# Patient Record
Sex: Male | Born: 1965 | Race: White | Hispanic: No | Marital: Married | Smoking: Never smoker
Health system: Southern US, Community
[De-identification: ages and names within clinical notes are randomized; demographics above are authoritative.]

## PROBLEM LIST (undated history)

## (undated) DIAGNOSIS — E785 Hyperlipidemia, unspecified: Secondary | ICD-10-CM

## (undated) HISTORY — PX: OTHER SURGICAL HISTORY: SHX169

## (undated) HISTORY — PX: VASECTOMY: SHX75

## (undated) HISTORY — DX: Hyperlipidemia, unspecified: E78.5

---

## 2005-09-25 ENCOUNTER — Encounter: Admission: RE | Admit: 2005-09-25 | Discharge: 2005-09-25 | Payer: Self-pay | Admitting: Otolaryngology

## 2008-01-06 IMAGING — RF DG ESOPHAGUS
7 of 8 series · 19 of 24 positions shown · non-contrast
Comparison: none

CLINICAL DATA: Dysphagia especially at the suprasternal notch level.   Treatment for GERD.
 DIAGNOSTIC ESOPHAGUS BARIUM SWALLOW:

[Series 1: run · 1 of 3 slices shown (1 of 7)]
[im 1/3]
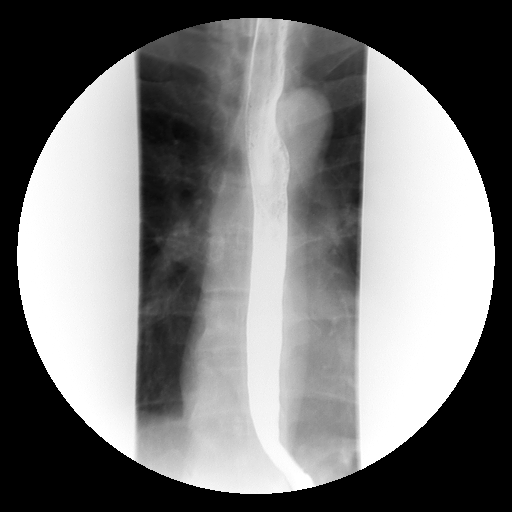

[Series 2: run · 1 of 2 slices shown (2 of 7)]
[im 1/2]
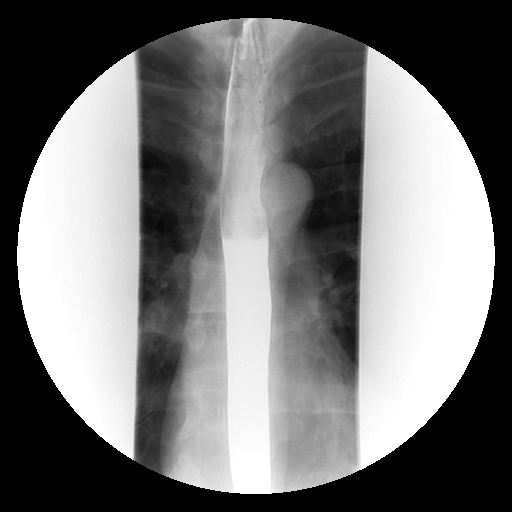

[Series 4: run · 5 of 13 slices shown (3 of 7)]
[im 1/13]
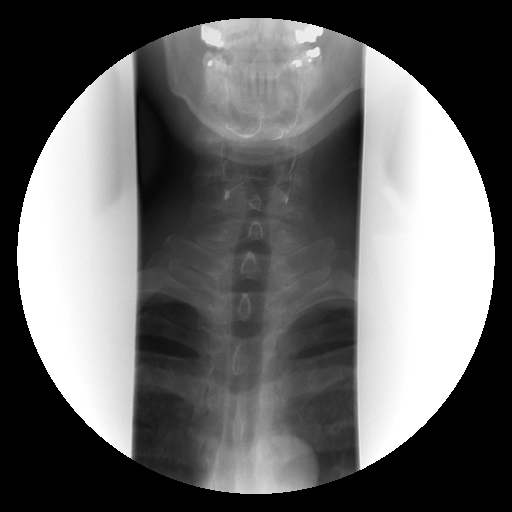
[im 3/13]
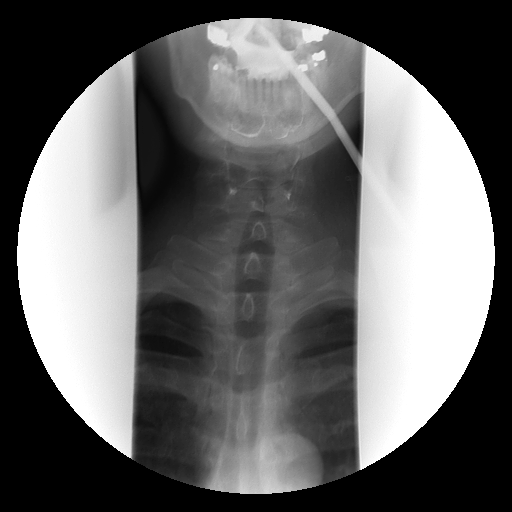
[im 5/13]
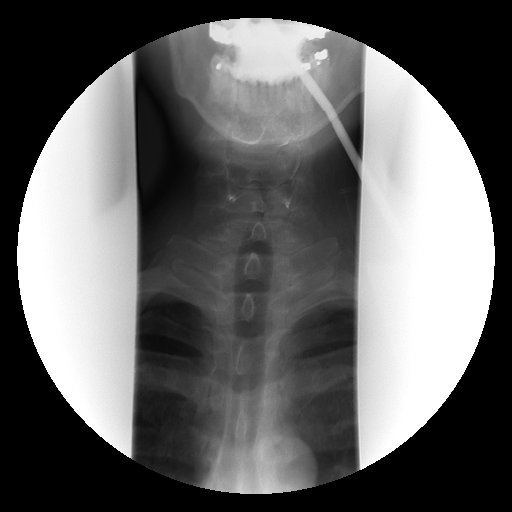
[im 8/13]
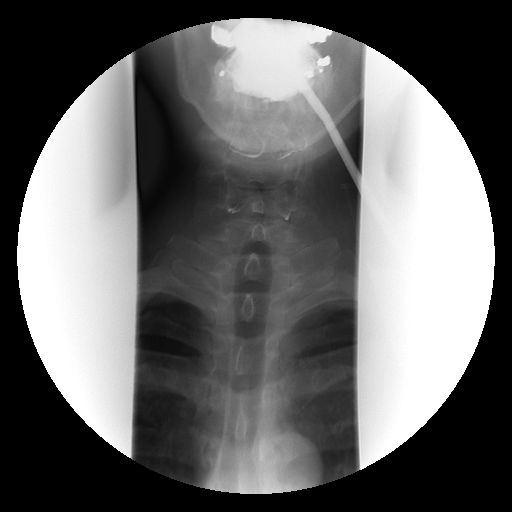
[im 13/13]
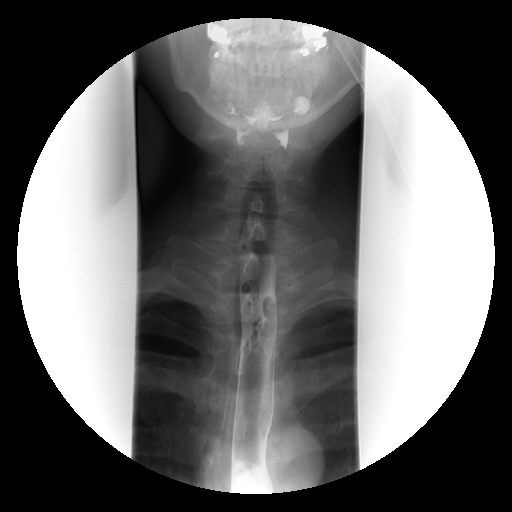

[Series 5: run · 2 of 8 slices shown (4 of 7)]
[im 1/8]
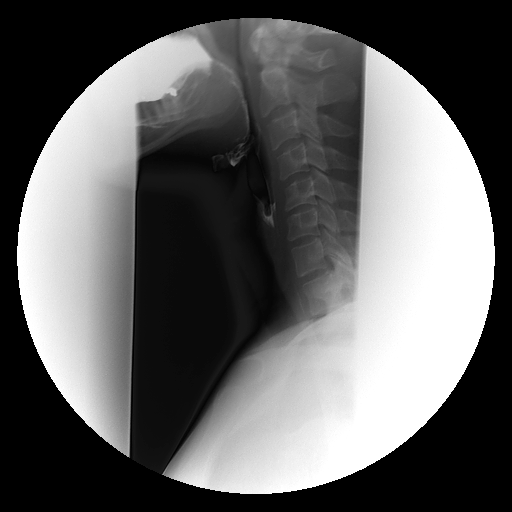
[im 4/8]
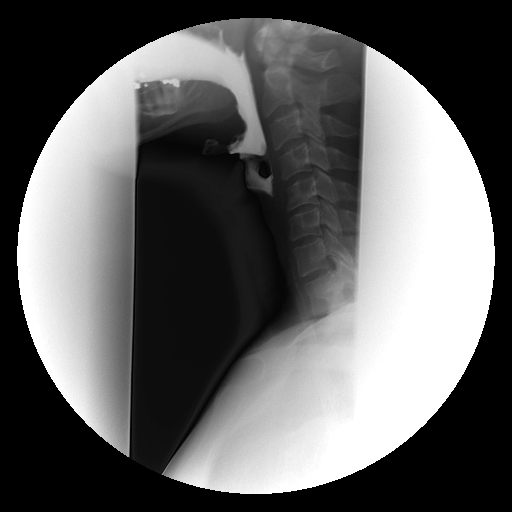

[Series 6: run · 5 of 13 slices shown (5 of 7)]
[im 1/13]
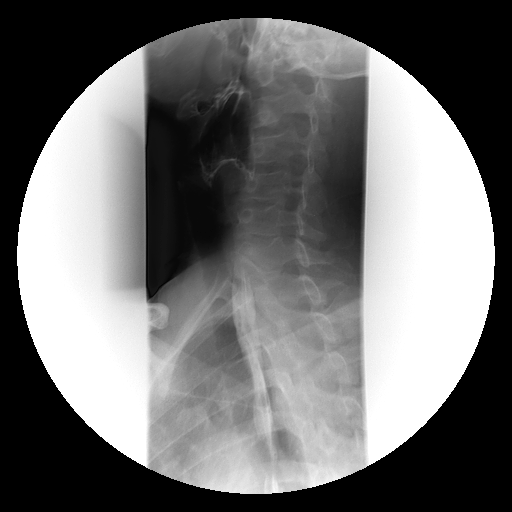
[im 3/13]
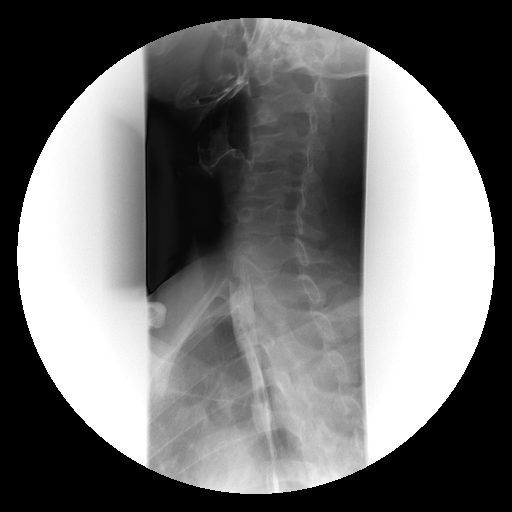
[im 5/13]
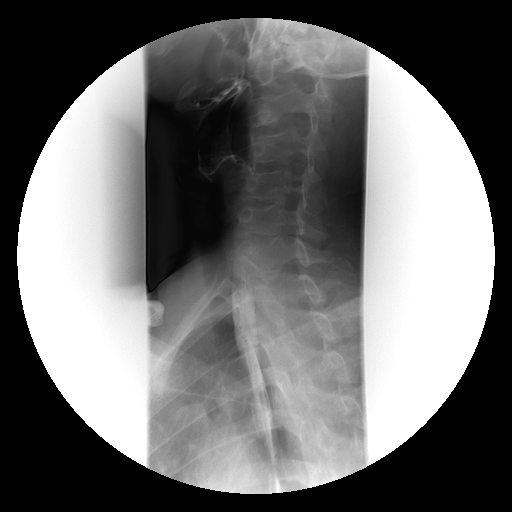
[im 8/13]
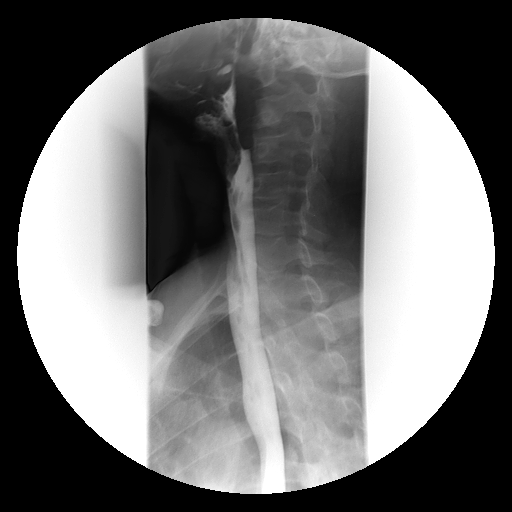
[im 13/13]
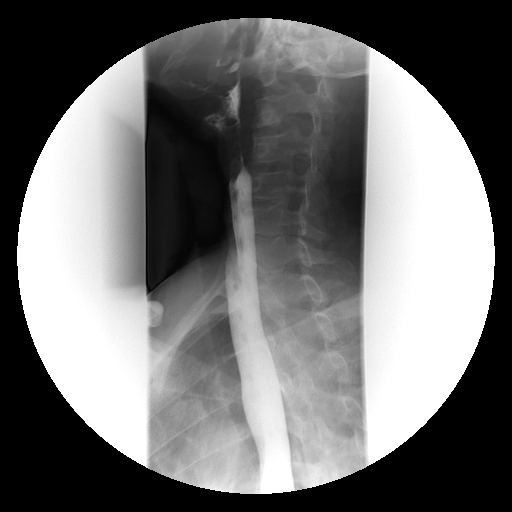

[Series 7: run · 1 of 3 slices shown (6 of 7)]
[im 1/3]
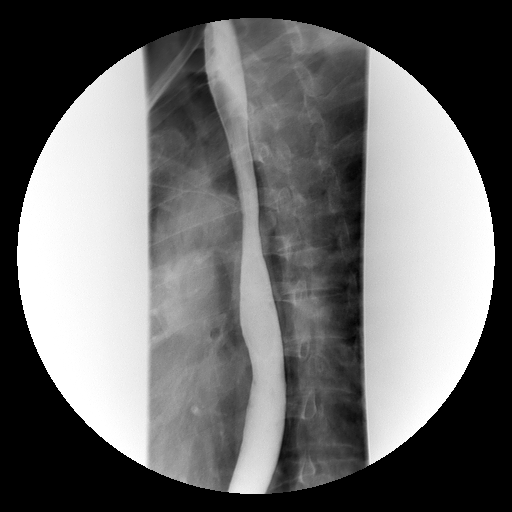

[Series 8: run · 4 of 11 slices shown (7 of 7)]
[im 1/11]
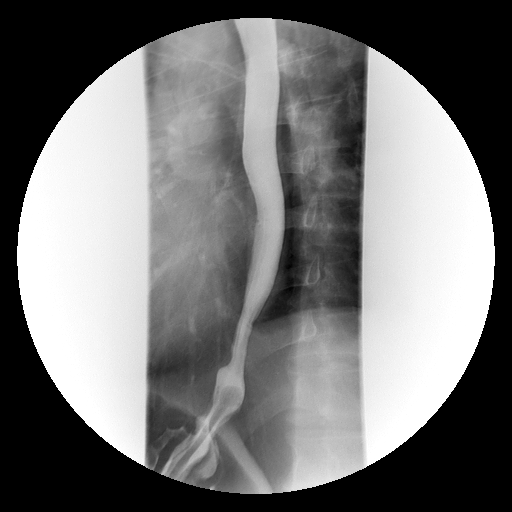
[im 3/11]
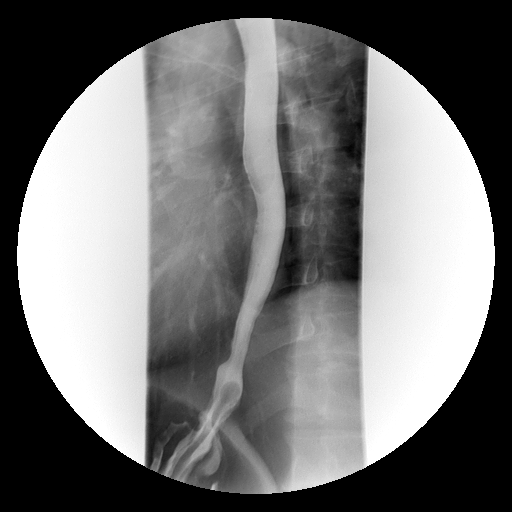
[im 8/11]
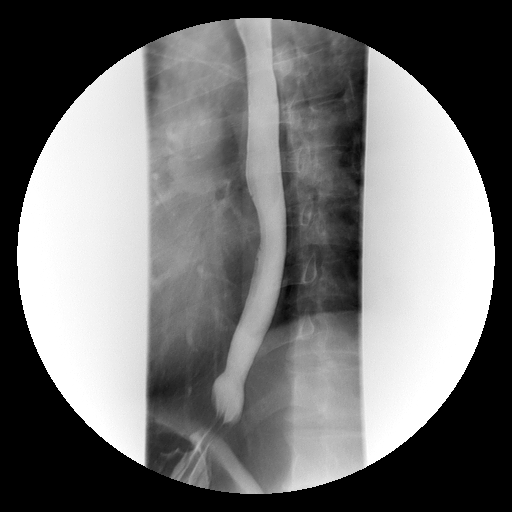
[im 11/11]
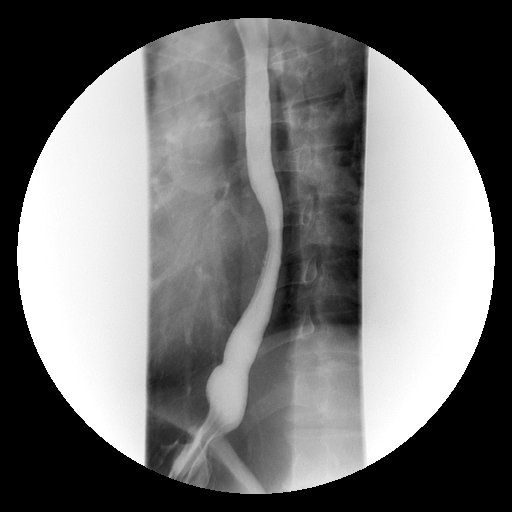

[19 of 24 positions shown; findings below may reference images not displayed]

FINDINGS: Normal antegrade peristalsis is seen with both double and single contrast techniques.  Ingested 13 mm barium tablet had slow passage at the thoracic esophagus at the level of the thoracic aortic arch.  No focal lesion or stricture is seen in the segment where barium tablet passage was slowed consistent with radiographically occult esophagitis.  Small sliding-type hiatal hernia is seen.  No demonstrable gastroesophageal reflux despite recumbent positioning, Valsalva maneuver, and water siphon test was demonstrated.   No other intrinsic nor extrinsic esophageal lesions are seen.
IMPRESSION: 1.  Small sliding-type hiatus hernia.
 2.  Slight slowing of 13 mm barium tablet at the thoracic esophagus (level of thoracic aortic arch) consistent with radiographically occult esophagitis presumably from gastroesophageal reflux currently not demonstrated.
 3.  Otherwise negative.

## 2018-10-24 DIAGNOSIS — Z0000 Encounter for general adult medical examination without abnormal findings: Secondary | ICD-10-CM | POA: Diagnosis not present

## 2018-10-31 DIAGNOSIS — Z1211 Encounter for screening for malignant neoplasm of colon: Secondary | ICD-10-CM | POA: Diagnosis not present

## 2018-10-31 DIAGNOSIS — R0989 Other specified symptoms and signs involving the circulatory and respiratory systems: Secondary | ICD-10-CM | POA: Diagnosis not present

## 2018-10-31 DIAGNOSIS — I10 Essential (primary) hypertension: Secondary | ICD-10-CM | POA: Diagnosis not present

## 2018-10-31 DIAGNOSIS — Z0000 Encounter for general adult medical examination without abnormal findings: Secondary | ICD-10-CM | POA: Diagnosis not present

## 2019-01-09 DIAGNOSIS — Z1211 Encounter for screening for malignant neoplasm of colon: Secondary | ICD-10-CM | POA: Diagnosis not present

## 2019-11-30 DIAGNOSIS — R079 Chest pain, unspecified: Secondary | ICD-10-CM | POA: Diagnosis not present

## 2019-11-30 DIAGNOSIS — Z6823 Body mass index (BMI) 23.0-23.9, adult: Secondary | ICD-10-CM | POA: Diagnosis not present

## 2019-12-11 DIAGNOSIS — Z20822 Contact with and (suspected) exposure to covid-19: Secondary | ICD-10-CM | POA: Diagnosis not present

## 2019-12-11 DIAGNOSIS — Z03818 Encounter for observation for suspected exposure to other biological agents ruled out: Secondary | ICD-10-CM | POA: Diagnosis not present

## 2020-08-06 ENCOUNTER — Other Ambulatory Visit: Payer: Self-pay

## 2020-08-06 ENCOUNTER — Encounter: Payer: Self-pay | Admitting: Family Medicine

## 2020-08-06 ENCOUNTER — Ambulatory Visit (INDEPENDENT_AMBULATORY_CARE_PROVIDER_SITE_OTHER): Payer: BC Managed Care – PPO | Admitting: Family Medicine

## 2020-08-06 VITALS — BP 109/71 | HR 78 | Temp 99.3°F | Ht 74.0 in | Wt 176.2 lb

## 2020-08-06 DIAGNOSIS — Z1159 Encounter for screening for other viral diseases: Secondary | ICD-10-CM

## 2020-08-06 DIAGNOSIS — E559 Vitamin D deficiency, unspecified: Secondary | ICD-10-CM

## 2020-08-06 DIAGNOSIS — Z0001 Encounter for general adult medical examination with abnormal findings: Secondary | ICD-10-CM

## 2020-08-06 DIAGNOSIS — E782 Mixed hyperlipidemia: Secondary | ICD-10-CM

## 2020-08-06 DIAGNOSIS — Z114 Encounter for screening for human immunodeficiency virus [HIV]: Secondary | ICD-10-CM

## 2020-08-06 DIAGNOSIS — Z0000 Encounter for general adult medical examination without abnormal findings: Secondary | ICD-10-CM

## 2020-08-06 DIAGNOSIS — D485 Neoplasm of uncertain behavior of skin: Secondary | ICD-10-CM

## 2020-08-06 DIAGNOSIS — Z125 Encounter for screening for malignant neoplasm of prostate: Secondary | ICD-10-CM

## 2020-08-06 MED ORDER — ATORVASTATIN CALCIUM 40 MG PO TABS: 40.0000 mg | ORAL_TABLET | Freq: Every day | ORAL | 3 refills | Status: DC

## 2020-08-06 NOTE — Progress Notes (Signed)
Established Patient Office Visit  Subjective:  Patient ID: Duane Sharp, male    DOB: Jun 15, 1965  Age: 55 y.o. MRN: [id number]  CC:  Chief Complaint  Patient presents with  . Establish Care    HPI Duane Sharp presents for complete physical and new patient visit.    in for follow-up of elevated cholesterol. Doing well without complaints on current medication. Denies side effects of statin including myalgia and arthralgia and nausea. Currently no chest pain, shortness of breath or other cardiovascular related symptoms noted.   Past Medical History:  Diagnosis Date  . Hyperlipidemia     Past Surgical History:  Procedure Laterality Date  . deviated septum repair    . VASECTOMY      Family History  Problem Relation Age of Onset  . Heart disease Mother   . Cancer Mother        stomach  . Multiple myeloma Father   . Heart disease Father     Social History   Socioeconomic History  . Marital status: Married    Spouse name: Almyra Free  . Number of children: 3  . Years of education: Not on file  . Highest education level: Master's degree (e.g., MA, MS, MEng, MEd, MSW, MBA)  Occupational History  . Occupation: Scientist, clinical (histocompatibility and immunogenetics): [employer]  Tobacco Use  . Smoking status: Never Smoker  . Smokeless tobacco: Current User  Vaping Use  . Vaping Use: Never used  Substance and Sexual Activity  . Alcohol use: Yes    Comment: weekend  . Drug use: Not Currently  . Sexual activity: Yes  Other Topics Concern  . Not on file  Social History Narrative  . Not on file   Social Determinants of Health   Financial Resource Strain: Not on file  Food Insecurity: Not on file  Transportation Needs: Not on file  Physical Activity: Not on file  Stress: Not on file  Social Connections: Not on file  Intimate Partner Violence: Not on file    Outpatient Medications Prior to Visit  Medication Sig Dispense Refill  . aspirin EC 81 MG tablet Take 81 mg by mouth daily.  Swallow whole.    . B Complex Vitamins (B COMPLEX PO) Take by mouth.    . Multiple Vitamin (MULTIVITAMIN ADULT PO) Take by mouth.    . simvastatin (ZOCOR) 20 MG tablet Take by mouth.     No facility-administered medications prior to visit.    No Known Allergies  ROS Review of Systems  Constitutional: Negative for activity change, fatigue and unexpected weight change.  HENT: Negative for congestion, ear pain, hearing loss, postnasal drip and trouble swallowing.   Eyes: Negative for pain and visual disturbance.  Respiratory: Negative for cough, chest tightness and shortness of breath.   Cardiovascular: Negative for chest pain, palpitations and leg swelling.  Gastrointestinal: Negative for abdominal distention, abdominal pain, blood in stool, constipation, diarrhea, nausea and vomiting.  Endocrine: Negative for cold intolerance, heat intolerance and polydipsia.  Genitourinary: Negative for difficulty urinating, dysuria, flank pain, frequency and urgency.  Musculoskeletal: Negative for arthralgias and joint swelling.  Skin: Negative for color change, rash and wound.  Neurological: Negative for dizziness, syncope, speech difficulty, weakness, light-headedness, numbness and headaches.  Hematological: Does not bruise/bleed easily.  Psychiatric/Behavioral: Negative for confusion, decreased concentration, dysphoric mood and sleep disturbance. The patient is not nervous/anxious.       Objective:    Physical Exam Constitutional:  Appearance: He is well-developed.  HENT:     Head: Normocephalic and atraumatic.  Eyes:     Pupils: Pupils are equal, round, and reactive to light.  Neck:     Thyroid: No thyromegaly.     Trachea: No tracheal deviation.  Cardiovascular:     Rate and Rhythm: Normal rate and regular rhythm.     Heart sounds: Normal heart sounds. No murmur heard. No friction rub. No gallop.   Pulmonary:     Breath sounds: Normal breath sounds. No wheezing or rales.   Abdominal:     Palpations: Abdomen is soft. There is no mass.     Tenderness: There is no abdominal tenderness.  Musculoskeletal:        General: Normal range of motion.     Cervical back: Normal range of motion.  Skin:    General: Skin is warm and dry.     Findings: Lesion ( 4 mm variegated lesion with irregular black center at mid back, midline.) present.  Neurological:     Mental Status: He is alert and oriented to person, place, and time.     BP 109/71   Pulse 78   Temp 99.3 F (37.4 C)   Ht 6' 2"  (1.88 m)   Wt 176 lb 3.2 oz (79.9 kg)   SpO2 97%   BMI 22.62 kg/m  Wt Readings from Last 3 Encounters:  08/06/20 176 lb 3.2 oz (79.9 kg)     Health Maintenance Due  Topic Date Due  . Hepatitis C Screening  Never done  . TETANUS/TDAP  Never done  . COLONOSCOPY (Pts 45-52yr Insurance coverage will need to be confirmed)  Never done    There are no preventive care reminders to display for this patient.  No results found for: TSH No results found for: WBC, HGB, HCT, MCV, PLT No results found for: NA, K, CHLORIDE, CO2, GLUCOSE, BUN, CREATININE, BILITOT, ALKPHOS, AST, ALT, PROT, ALBUMIN, CALCIUM, ANIONGAP, EGFR, GFR No results found for: CHOL No results found for: HDL No results found for: LDLCALC No results found for: TRIG No results found for: CHOLHDL No results found for: HGBA1C    Assessment & Plan:   Problem List Items Addressed This Visit   None   Visit Diagnoses    Well adult exam    -  Primary   Relevant Orders   CBC with Differential/Platelet   CMP14+EGFR   Lipid panel   PSA, total and free   Urinalysis   VITAMIN D 25 Hydroxy (Vit-D Deficiency, Fractures)   Hepatitis C antibody   HIV Antibody (routine testing w rflx)   Mixed hyperlipidemia       Relevant Medications   aspirin EC 81 MG tablet   atorvastatin (LIPITOR) 40 MG tablet   Other Relevant Orders   CBC with Differential/Platelet   CMP14+EGFR   Lipid panel   Vitamin D deficiency        Relevant Orders   VITAMIN D 25 Hydroxy (Vit-D Deficiency, Fractures)   Need for hepatitis C screening test       Relevant Orders   Hepatitis C antibody   Screening for prostate cancer       Relevant Orders   PSA, total and free   Encounter for screening for HIV       Relevant Orders   HIV Antibody (routine testing w rflx)   Neoplasm of uncertain behavior of skin       Relevant Orders   Ambulatory referral to Dermatology  Meds ordered this encounter  Medications  . atorvastatin (LIPITOR) 40 MG tablet    Sig: Take 1 tablet (40 mg total) by mouth daily. For cholesterol    Dispense:  90 tablet    Refill:  3    Follow-up: No follow-ups on file.    Claretta Fraise, MD

## 2020-08-07 ENCOUNTER — Other Ambulatory Visit: Payer: BC Managed Care – PPO

## 2020-08-07 DIAGNOSIS — Z1159 Encounter for screening for other viral diseases: Secondary | ICD-10-CM | POA: Diagnosis not present

## 2020-08-07 DIAGNOSIS — Z125 Encounter for screening for malignant neoplasm of prostate: Secondary | ICD-10-CM | POA: Diagnosis not present

## 2020-08-07 DIAGNOSIS — Z114 Encounter for screening for human immunodeficiency virus [HIV]: Secondary | ICD-10-CM | POA: Diagnosis not present

## 2020-08-07 DIAGNOSIS — Z0000 Encounter for general adult medical examination without abnormal findings: Secondary | ICD-10-CM | POA: Diagnosis not present

## 2020-08-07 DIAGNOSIS — E559 Vitamin D deficiency, unspecified: Secondary | ICD-10-CM | POA: Diagnosis not present

## 2020-08-07 DIAGNOSIS — E782 Mixed hyperlipidemia: Secondary | ICD-10-CM | POA: Diagnosis not present

## 2020-08-07 LAB — CBC WITH DIFFERENTIAL/PLATELET
Eos: 3 %
MCHC: 34.4 g/dL (ref 31.5–35.7)
Monocytes Absolute: 0.6 10*3/uL (ref 0.1–0.9)
Neutrophils Absolute: 3.2 10*3/uL (ref 1.4–7.0)
Platelets: 171 10*3/uL (ref 150–450)

## 2020-08-07 LAB — VITAMIN D 25 HYDROXY (VIT D DEFICIENCY, FRACTURES)

## 2020-08-07 LAB — URINALYSIS
Bilirubin, UA: NEGATIVE
Glucose, UA: NEGATIVE
Ketones, UA: NEGATIVE
Leukocytes,UA: NEGATIVE
Nitrite, UA: NEGATIVE
Protein,UA: NEGATIVE
RBC, UA: NEGATIVE
Specific Gravity, UA: 1.01 (ref 1.005–1.030)
Urobilinogen, Ur: 0.2 mg/dL (ref 0.2–1.0)
pH, UA: 7 (ref 5.0–7.5)

## 2020-08-07 LAB — PSA, TOTAL AND FREE

## 2020-08-07 LAB — CMP14+EGFR

## 2020-08-08 LAB — CBC WITH DIFFERENTIAL/PLATELET
Basophils Absolute: 0 10*3/uL (ref 0.0–0.2)
Basos: 1 %
EOS (ABSOLUTE): 0.1 10*3/uL (ref 0.0–0.4)
Hematocrit: 47.1 % (ref 37.5–51.0)
Hemoglobin: 16.2 g/dL (ref 13.0–17.7)
Immature Grans (Abs): 0 10*3/uL (ref 0.0–0.1)
Immature Granulocytes: 0 %
Lymphocytes Absolute: 1.5 10*3/uL (ref 0.7–3.1)
Lymphs: 28 %
MCH: 30.8 pg (ref 26.6–33.0)
MCV: 90 fL (ref 79–97)
Monocytes: 10 %
Neutrophils: 58 %
RBC: 5.26 x10E6/uL (ref 4.14–5.80)
RDW: 12.9 % (ref 11.6–15.4)
WBC: 5.5 10*3/uL (ref 3.4–10.8)

## 2020-08-08 LAB — HIV ANTIBODY (ROUTINE TESTING W REFLEX): HIV Screen 4th Generation wRfx: NONREACTIVE

## 2020-08-08 LAB — CMP14+EGFR
ALT: 24 IU/L (ref 0–44)
AST: 28 IU/L (ref 0–40)
Alkaline Phosphatase: 77 IU/L (ref 44–121)
BUN: 7 mg/dL (ref 6–24)
Bilirubin Total: 1.4 mg/dL — ABNORMAL HIGH (ref 0.0–1.2)
CO2: 24 mmol/L (ref 20–29)
Calcium: 9.3 mg/dL (ref 8.7–10.2)
Chloride: 99 mmol/L (ref 96–106)
Creatinine, Ser: 0.98 mg/dL (ref 0.76–1.27)
Globulin, Total: 1.7 g/dL (ref 1.5–4.5)
Glucose: 90 mg/dL (ref 65–99)
Potassium: 4.3 mmol/L (ref 3.5–5.2)
Sodium: 137 mmol/L (ref 134–144)
Total Protein: 6.6 g/dL (ref 6.0–8.5)
eGFR: 91 mL/min/{1.73_m2} (ref >59)

## 2020-08-08 LAB — PSA, TOTAL AND FREE
PSA, Free Pct: 50 %
Prostate Specific Ag, Serum: 0.5 ng/mL (ref 0.0–4.0)

## 2020-08-08 LAB — LIPID PANEL
Chol/HDL Ratio: 3.2 ratio (ref 0.0–5.0)
Cholesterol, Total: 143 mg/dL (ref 100–199)
HDL: 45 mg/dL (ref >39)
LDL Chol Calc (NIH): 75 mg/dL (ref 0–99)
Triglycerides: 132 mg/dL (ref 0–149)
VLDL Cholesterol Cal: 23 mg/dL (ref 5–40)

## 2020-08-08 LAB — HEPATITIS C ANTIBODY: Hep C Virus Ab: <0.1 s/co ratio (ref 0.0–0.9)

## 2020-08-08 NOTE — Progress Notes (Signed)
Dear Darlyn Chamber, Your Vitamin D is  low. You need a prescription strength supplement I will send that in for you. Nurse, if at all possible, could you send in a prescription for the patient for vitamin D 50,000 units, 1 p.o. weekly #13 with 3 refills? Many thanks, WS

## 2020-08-09 ENCOUNTER — Other Ambulatory Visit: Payer: Self-pay | Admitting: *Deleted

## 2020-08-09 MED ORDER — VITAMIN D (ERGOCALCIFEROL) 1.25 MG (50000 UNIT) PO CAPS: 50000.0000 [IU] | ORAL_CAPSULE | ORAL | 3 refills | Status: DC

## 2020-09-18 DIAGNOSIS — D225 Melanocytic nevi of trunk: Secondary | ICD-10-CM | POA: Diagnosis not present

## 2020-09-19 DIAGNOSIS — I788 Other diseases of capillaries: Secondary | ICD-10-CM | POA: Diagnosis not present

## 2020-09-19 DIAGNOSIS — L814 Other melanin hyperpigmentation: Secondary | ICD-10-CM | POA: Diagnosis not present

## 2020-09-19 DIAGNOSIS — D485 Neoplasm of uncertain behavior of skin: Secondary | ICD-10-CM | POA: Diagnosis not present

## 2020-09-19 DIAGNOSIS — L821 Other seborrheic keratosis: Secondary | ICD-10-CM | POA: Diagnosis not present

## 2020-10-07 ENCOUNTER — Telehealth: Payer: Self-pay | Admitting: Family Medicine

## 2020-10-07 MED ORDER — SIMVASTATIN 20 MG PO TABS: 20.0000 mg | ORAL_TABLET | Freq: Every day | ORAL | 2 refills | Status: DC

## 2020-10-07 NOTE — Telephone Encounter (Signed)
Patient is wanting to know if he can continue with the simvastatin that he was on since his cholesterol is good. Please advise

## 2020-10-07 NOTE — Telephone Encounter (Signed)
Yes. Please continue

## 2020-10-07 NOTE — Telephone Encounter (Signed)
Patient aware, med refill sent in.

## 2021-01-05 ENCOUNTER — Other Ambulatory Visit: Payer: Self-pay | Admitting: Family Medicine

## 2021-01-06 NOTE — Telephone Encounter (Signed)
OV 08/06/20 RTC 1 yr Next OV 08/07/21

## 2021-01-13 DIAGNOSIS — D485 Neoplasm of uncertain behavior of skin: Secondary | ICD-10-CM | POA: Diagnosis not present

## 2021-01-13 DIAGNOSIS — L988 Other specified disorders of the skin and subcutaneous tissue: Secondary | ICD-10-CM | POA: Diagnosis not present

## 2021-03-31 ENCOUNTER — Ambulatory Visit: Payer: BC Managed Care – PPO | Admitting: Family Medicine

## 2021-03-31 ENCOUNTER — Encounter: Payer: Self-pay | Admitting: Family Medicine

## 2021-03-31 VITALS — BP 110/62 | HR 75 | Temp 98.0°F | Ht 74.0 in | Wt 186.2 lb

## 2021-03-31 DIAGNOSIS — Z23 Encounter for immunization: Secondary | ICD-10-CM | POA: Diagnosis not present

## 2021-03-31 DIAGNOSIS — N529 Male erectile dysfunction, unspecified: Secondary | ICD-10-CM

## 2021-03-31 MED ORDER — SILDENAFIL CITRATE 20 MG PO TABS: ORAL_TABLET | ORAL | 5 refills | Status: DC

## 2021-03-31 NOTE — Progress Notes (Signed)
Subjective:  Patient ID: DEMARRIO MENGES, male    DOB: 05-Jan-1966  Age: 56 y.o. MRN: [id number]  CC: Erectile Dysfunction   HPI Duane Sharp presents for ED increasing over 2 years. No problem with relationship. Desire is good. Married 24 years. Quit smoking 18 years ago. Able to workout hard. Energy, strength are at usual levels.   Depression screen [facility] 2/9 03/31/2021 08/06/2020  Decreased Interest 0 0  Down, Depressed, Hopeless 0 0  PHQ - 2 Score 0 0    History Duane Sharp has a past medical history of Hyperlipidemia.   Duane Sharp has a past surgical history that includes deviated septum repair and Vasectomy.   His family history includes Cancer in his mother; Heart disease in his father and mother; Multiple myeloma in his father.Duane Sharp reports that Duane Sharp has never smoked. Duane Sharp uses smokeless tobacco. Duane Sharp reports current alcohol use. Duane Sharp reports that Duane Sharp does not currently use drugs.    ROS Review of Systems  Constitutional:  Negative for fever.  Respiratory:  Negative for shortness of breath.   Cardiovascular:  Negative for chest pain.  Musculoskeletal:  Negative for arthralgias.  Skin:  Negative for rash.   Objective:  BP 110/62    Pulse 75    Temp 98 F (36.7 C)    Ht 6' 2"  (1.88 m)    Wt 186 lb 3.2 oz (84.5 kg)    SpO2 97%    BMI 23.91 kg/m   BP Readings from Last 3 Encounters:  03/31/21 110/62  08/06/20 109/71    Wt Readings from Last 3 Encounters:  03/31/21 186 lb 3.2 oz (84.5 kg)  08/06/20 176 lb 3.2 oz (79.9 kg)     Physical Exam Vitals reviewed.  Constitutional:      Appearance: Duane Sharp is well-developed.  HENT:     Head: Normocephalic and atraumatic.     Right Ear: External ear normal.     Left Ear: External ear normal.     Mouth/Throat:     Pharynx: No oropharyngeal exudate or posterior oropharyngeal erythema.  Eyes:     Pupils: Pupils are equal, round, and reactive to light.  Cardiovascular:     Rate and Rhythm: Normal rate and regular rhythm.     Heart sounds: No murmur  heard. Pulmonary:     Effort: No respiratory distress.     Breath sounds: Normal breath sounds.  Abdominal:     Hernia: There is no hernia in the left inguinal area or right inguinal area.  Genitourinary:    Pubic Area: No rash.      Penis: Normal and circumcised. No phimosis, hypospadias, erythema, tenderness, swelling or lesions.      Testes: Normal.        Right: Mass, tenderness, swelling, testicular hydrocele or varicocele not present.        Left: Mass, tenderness, swelling, testicular hydrocele or varicocele not present.     Epididymis:     Right: Normal.     Left: Normal.     Tanner stage (genital): 5.  Musculoskeletal:     Cervical back: Normal range of motion and neck supple.  Neurological:     Mental Status: Duane Sharp is alert and oriented to person, place, and time.      Assessment & Plan:   Duane Sharp was seen today for erectile dysfunction.  Diagnoses and all orders for this visit:  Erectile dysfunction, unspecified erectile dysfunction type -     Testosterone,Free and Total -  TSH  Need for immunization against influenza -     Flu Vaccine QUAD 42moIM (Fluarix, Fluzone & Alfiuria Quad PF)  Other orders -     sildenafil (REVATIO) 20 MG tablet; Take 2-5 pills at once, orally, with each sexual encounter       I am having WTeressa Senter Fero start on sildenafil. I am also having him maintain his Multiple Vitamin (MULTIVITAMIN ADULT PO), B Complex Vitamins (B COMPLEX PO), aspirin EC, Vitamin D (Ergocalciferol), and simvastatin.  Allergies as of 03/31/2021   No Known Allergies      Medication List        Accurate as of March 31, 2021  8:41 PM. If you have any questions, ask your nurse or doctor.          aspirin EC 81 MG tablet Take 81 mg by mouth daily. Swallow whole.   B COMPLEX PO Take by mouth.   MULTIVITAMIN ADULT PO Take by mouth.   sildenafil 20 MG tablet Commonly known as: REVATIO Take 2-5 pills at once, orally, with each sexual  encounter Started by: WClaretta Fraise MD   simvastatin 20 MG tablet Commonly known as: ZOCOR TAKE 1 TABLET BY MOUTH EVERY DAY   Vitamin D (Ergocalciferol) 1.25 MG (50000 UNIT) Caps capsule Commonly known as: DRISDOL Take 1 capsule (50,000 Units total) by mouth every 7 (seven) days.         Follow-up: Return in about 6 months (around 09/28/2021).  WClaretta Fraise M.D.

## 2021-04-01 ENCOUNTER — Other Ambulatory Visit: Payer: BC Managed Care – PPO

## 2021-04-01 DIAGNOSIS — N529 Male erectile dysfunction, unspecified: Secondary | ICD-10-CM | POA: Diagnosis not present

## 2021-04-03 LAB — TESTOSTERONE,FREE AND TOTAL
Testosterone, Free: 10.1 pg/mL (ref 7.2–24.0)
Testosterone: 277 ng/dL (ref 264–916)

## 2021-04-03 LAB — TSH: TSH: 3.92 u[IU]/mL (ref 0.450–4.500)

## 2021-08-07 ENCOUNTER — Encounter: Payer: BC Managed Care – PPO | Admitting: Family Medicine

## 2021-08-21 ENCOUNTER — Other Ambulatory Visit: Payer: Self-pay | Admitting: Family Medicine

## 2022-01-26 ENCOUNTER — Other Ambulatory Visit: Payer: Self-pay | Admitting: Family Medicine

## 2022-01-26 ENCOUNTER — Telehealth: Payer: Self-pay | Admitting: Family Medicine

## 2022-01-26 DIAGNOSIS — E782 Mixed hyperlipidemia: Secondary | ICD-10-CM

## 2022-01-26 DIAGNOSIS — E559 Vitamin D deficiency, unspecified: Secondary | ICD-10-CM

## 2022-01-26 NOTE — Telephone Encounter (Signed)
Labs ordered. Let pt. Know.

## 2022-01-26 NOTE — Telephone Encounter (Signed)
Patient would like to come in before his appointment to have lab work done. Please call when orders have been added.

## 2022-01-26 NOTE — Telephone Encounter (Signed)
What labs would you like for him?

## 2022-01-26 NOTE — Telephone Encounter (Signed)
Pt aware.

## 2022-04-01 ENCOUNTER — Other Ambulatory Visit: Payer: BC Managed Care – PPO

## 2022-04-01 DIAGNOSIS — E782 Mixed hyperlipidemia: Secondary | ICD-10-CM | POA: Diagnosis not present

## 2022-04-01 DIAGNOSIS — Z1329 Encounter for screening for other suspected endocrine disorder: Secondary | ICD-10-CM | POA: Diagnosis not present

## 2022-04-01 DIAGNOSIS — E559 Vitamin D deficiency, unspecified: Secondary | ICD-10-CM | POA: Diagnosis not present

## 2022-04-01 DIAGNOSIS — Z125 Encounter for screening for malignant neoplasm of prostate: Secondary | ICD-10-CM | POA: Diagnosis not present

## 2022-04-01 LAB — CBC WITH DIFFERENTIAL/PLATELET
Basos: 1 %
Eos: 5 %
Hemoglobin: 14.7 g/dL (ref 13.0–17.7)
Immature Grans (Abs): 0 10*3/uL (ref 0.0–0.1)
Lymphs: 33 %
Neutrophils: 48 %
Platelets: 172 10*3/uL (ref 150–450)
RBC: 4.94 x10E6/uL (ref 4.14–5.80)
WBC: 4.8 10*3/uL (ref 3.4–10.8)

## 2022-04-01 LAB — CMP14+EGFR
ALT: 31 IU/L (ref 0–44)
Albumin/Globulin Ratio: 1.9 (ref 1.2–2.2)
Albumin: 4.2 g/dL (ref 3.8–4.9)
BUN/Creatinine Ratio: 9 (ref 9–20)
BUN: 9 mg/dL (ref 6–24)
Bilirubin Total: 1.4 mg/dL — ABNORMAL HIGH (ref 0.0–1.2)
CO2: 23 mmol/L (ref 20–29)
Glucose: 91 mg/dL (ref 70–99)
Sodium: 137 mmol/L (ref 134–144)
eGFR: 91 mL/min/{1.73_m2} (ref >59)

## 2022-04-01 LAB — LIPID PANEL

## 2022-04-01 LAB — VITAMIN D 25 HYDROXY (VIT D DEFICIENCY, FRACTURES)

## 2022-04-02 ENCOUNTER — Ambulatory Visit (INDEPENDENT_AMBULATORY_CARE_PROVIDER_SITE_OTHER): Payer: BC Managed Care – PPO | Admitting: Family Medicine

## 2022-04-02 ENCOUNTER — Encounter: Payer: Self-pay | Admitting: Family Medicine

## 2022-04-02 VITALS — BP 114/71 | HR 71 | Temp 97.2°F | Ht 74.0 in | Wt 182.8 lb

## 2022-04-02 DIAGNOSIS — E782 Mixed hyperlipidemia: Secondary | ICD-10-CM

## 2022-04-02 DIAGNOSIS — Z0000 Encounter for general adult medical examination without abnormal findings: Secondary | ICD-10-CM

## 2022-04-02 DIAGNOSIS — Z0001 Encounter for general adult medical examination with abnormal findings: Secondary | ICD-10-CM | POA: Diagnosis not present

## 2022-04-02 DIAGNOSIS — N529 Male erectile dysfunction, unspecified: Secondary | ICD-10-CM | POA: Diagnosis not present

## 2022-04-02 DIAGNOSIS — E559 Vitamin D deficiency, unspecified: Secondary | ICD-10-CM | POA: Diagnosis not present

## 2022-04-02 DIAGNOSIS — Z125 Encounter for screening for malignant neoplasm of prostate: Secondary | ICD-10-CM

## 2022-04-02 LAB — CBC WITH DIFFERENTIAL/PLATELET
Basophils Absolute: 0 10*3/uL (ref 0.0–0.2)
EOS (ABSOLUTE): 0.2 10*3/uL (ref 0.0–0.4)
Hematocrit: 42.9 % (ref 37.5–51.0)
Immature Granulocytes: 0 %
Lymphocytes Absolute: 1.6 10*3/uL (ref 0.7–3.1)
MCH: 29.8 pg (ref 26.6–33.0)
MCHC: 34.3 g/dL (ref 31.5–35.7)
MCV: 87 fL (ref 79–97)
Monocytes Absolute: 0.6 10*3/uL (ref 0.1–0.9)
Monocytes: 13 %
Neutrophils Absolute: 2.3 10*3/uL (ref 1.4–7.0)
RDW: 12.6 % (ref 11.6–15.4)

## 2022-04-02 LAB — LIPID PANEL
Chol/HDL Ratio: 5.6 ratio — ABNORMAL HIGH (ref 0.0–5.0)
LDL Chol Calc (NIH): 138 mg/dL — ABNORMAL HIGH (ref 0–99)
Triglycerides: 225 mg/dL — ABNORMAL HIGH (ref 0–149)
VLDL Cholesterol Cal: 41 mg/dL — ABNORMAL HIGH (ref 5–40)

## 2022-04-02 LAB — CMP14+EGFR
AST: 21 IU/L (ref 0–40)
Alkaline Phosphatase: 69 IU/L (ref 44–121)
Calcium: 9.3 mg/dL (ref 8.7–10.2)
Chloride: 100 mmol/L (ref 96–106)
Creatinine, Ser: 0.97 mg/dL (ref 0.76–1.27)
Globulin, Total: 2.2 g/dL (ref 1.5–4.5)
Potassium: 4.7 mmol/L (ref 3.5–5.2)
Total Protein: 6.4 g/dL (ref 6.0–8.5)

## 2022-04-02 MED ORDER — ROSUVASTATIN CALCIUM 10 MG PO TABS: 10.0000 mg | ORAL_TABLET | Freq: Every day | ORAL | 1 refills | Status: DC

## 2022-04-02 MED ORDER — CALCIUM CARBONATE 600 MG PO TABS: 600.0000 mg | ORAL_TABLET | Freq: Two times a day (BID) | ORAL | 99 refills | Status: AC

## 2022-04-02 MED ORDER — SILDENAFIL CITRATE 20 MG PO TABS: ORAL_TABLET | ORAL | 5 refills | Status: DC

## 2022-04-02 MED ORDER — VITAMIN D (ERGOCALCIFEROL) 1.25 MG (50000 UNIT) PO CAPS: 50000.0000 [IU] | ORAL_CAPSULE | ORAL | 3 refills | Status: DC

## 2022-04-02 NOTE — Progress Notes (Signed)
Subjective:  Patient ID: Duane Sharp, male    DOB: 08/07/1965  Age: 57 y.o. MRN: [id number]  CC: Annual Exam   HPI Duane Sharp presents for CPE  Muscle aches with simvastatin. Stopped takng 2 mos ago. Dced Vit D aaafter finishing the bottle. Drinks a lot of milk. Avoids soda.      04/02/2022    8:00 AM 03/31/2021    3:52 PM 08/06/2020   11:01 AM  Depression screen PHQ 2/9  Decreased Interest 0 0 0  Down, Depressed, Hopeless 0 0 0  PHQ - 2 Score 0 0 0    History Duane Sharp has a past medical history of Hyperlipidemia.   He has a past surgical history that includes deviated septum repair and Vasectomy.   His family history includes Cancer in his mother; Heart disease in his father and mother; Multiple myeloma in his father.He reports that he has never smoked. He uses smokeless tobacco. He reports current alcohol use. He reports that he does not currently use drugs.    ROS Review of Systems  Constitutional:  Negative for activity change, fatigue and unexpected weight change.  HENT:  Negative for congestion, ear pain, hearing loss, postnasal drip and trouble swallowing.   Eyes:  Negative for pain and visual disturbance.  Respiratory:  Negative for cough, chest tightness and shortness of breath.   Cardiovascular:  Negative for chest pain, palpitations and leg swelling.  Gastrointestinal:  Negative for abdominal distention, abdominal pain, blood in stool, constipation, diarrhea, nausea and vomiting.  Endocrine: Negative for cold intolerance, heat intolerance and polydipsia.  Genitourinary:  Negative for difficulty urinating, dysuria, flank pain, frequency and urgency.  Musculoskeletal:  Positive for myalgias (with zocor). Negative for arthralgias and joint swelling.  Skin:  Negative for color change, rash and wound.  Neurological:  Negative for dizziness, syncope, speech difficulty, weakness, light-headedness, numbness and headaches.  Hematological:  Does not bruise/bleed  easily.  Psychiatric/Behavioral:  Negative for confusion, decreased concentration, dysphoric mood and sleep disturbance. The patient is not nervous/anxious.     Objective:  BP 114/71   Pulse 71   Temp (!) 97.2 F (36.2 C)   Ht '6\' 2"'$  (1.88 m)   Wt 182 lb 12.8 oz (82.9 kg)   SpO2 94%   BMI 23.47 kg/m   BP Readings from Last 3 Encounters:  04/02/22 114/71  03/31/21 110/62  08/06/20 109/71    Wt Readings from Last 3 Encounters:  04/02/22 182 lb 12.8 oz (82.9 kg)  03/31/21 186 lb 3.2 oz (84.5 kg)  08/06/20 176 lb 3.2 oz (79.9 kg)     Physical Exam Constitutional:      Appearance: He is well-developed.  HENT:     Head: Normocephalic and atraumatic.  Eyes:     Pupils: Pupils are equal, round, and reactive to light.  Neck:     Thyroid: No thyromegaly.     Trachea: No tracheal deviation.  Cardiovascular:     Rate and Rhythm: Normal rate and regular rhythm.     Heart sounds: Normal heart sounds. No murmur heard.    No friction rub. No gallop.  Pulmonary:     Breath sounds: Normal breath sounds. No wheezing or rales.  Abdominal:     General: Bowel sounds are normal. There is no distension.     Palpations: Abdomen is soft. There is no mass.     Tenderness: There is no abdominal tenderness.     Hernia: There is no hernia in the  left inguinal area.  Genitourinary:    Penis: Normal.      Testes: Normal.  Musculoskeletal:        General: Normal range of motion.     Cervical back: Normal range of motion.  Lymphadenopathy:     Cervical: No cervical adenopathy.  Skin:    General: Skin is warm and dry.  Neurological:     Mental Status: He is alert and oriented to person, place, and time.       Assessment & Plan:   Duane Sharp was seen today for annual exam.  Diagnoses and all orders for this visit:  Well adult exam -     Urinalysis  Vitamin D deficiency  Mixed hyperlipidemia  Erectile dysfunction, unspecified erectile dysfunction type -     Testosterone,Free and  Total  Screening for prostate cancer -     PSA, total and free  Other orders -     Vitamin D, Ergocalciferol, (DRISDOL) 1.25 MG (50000 UNIT) CAPS capsule; Take 1 capsule (50,000 Units total) by mouth every 7 (seven) days. -     sildenafil (REVATIO) 20 MG tablet; Take 2-5 pills at once, orally, with each sexual encounter -     rosuvastatin (CRESTOR) 10 MG tablet; Take 1 tablet (10 mg total) by mouth daily. For cholesterol -     calcium carbonate (CALCIUM 600) 600 MG TABS tablet; Take 1 tablet (600 mg total) by mouth 2 (two) times daily with a meal.       I have discontinued Duane Sharp. Cara's simvastatin. I am also having him start on rosuvastatin and calcium carbonate. Additionally, I am having him maintain his Multiple Vitamin (MULTIVITAMIN ADULT PO), B Complex Vitamins (B COMPLEX PO), aspirin EC, Vitamin D (Ergocalciferol), and sildenafil.  Allergies as of 04/02/2022       Reactions   Simvastatin    myalgia        Medication List        Accurate as of April 02, 2022  8:33 AM. If you have any questions, ask your nurse or doctor.          STOP taking these medications    simvastatin 20 MG tablet Commonly known as: ZOCOR Stopped by: Claretta Fraise, MD       TAKE these medications    aspirin EC 81 MG tablet Take 81 mg by mouth daily. Swallow whole.   B COMPLEX PO Take by mouth.   calcium carbonate 600 MG Tabs tablet Commonly known as: Calcium 600 Take 1 tablet (600 mg total) by mouth 2 (two) times daily with a meal. Started by: Claretta Fraise, MD   MULTIVITAMIN ADULT PO Take by mouth.   rosuvastatin 10 MG tablet Commonly known as: Crestor Take 1 tablet (10 mg total) by mouth daily. For cholesterol Started by: Claretta Fraise, MD   sildenafil 20 MG tablet Commonly known as: REVATIO Take 2-5 pills at once, orally, with each sexual encounter   Vitamin D (Ergocalciferol) 1.25 MG (50000 UNIT) Caps capsule Commonly known as: DRISDOL Take 1 capsule (50,000  Units total) by mouth every 7 (seven) days.         Follow-up: Return in about 1 year (around 04/03/2023).  Claretta Fraise, M.D.

## 2022-04-03 LAB — TESTOSTERONE

## 2022-04-04 LAB — PSA: Prostate Specific Ag, Serum: 0.4 ng/mL (ref 0.0–4.0)

## 2022-04-04 LAB — SPECIMEN STATUS REPORT

## 2022-08-26 ENCOUNTER — Ambulatory Visit (INDEPENDENT_AMBULATORY_CARE_PROVIDER_SITE_OTHER): Payer: PRIVATE HEALTH INSURANCE | Admitting: Family Medicine

## 2022-08-26 ENCOUNTER — Encounter: Payer: Self-pay | Admitting: Family Medicine

## 2022-08-26 VITALS — BP 117/68 | HR 73 | Temp 97.3°F | Ht 74.0 in | Wt 190.0 lb

## 2022-08-26 DIAGNOSIS — Z131 Encounter for screening for diabetes mellitus: Secondary | ICD-10-CM

## 2022-08-26 DIAGNOSIS — R351 Nocturia: Secondary | ICD-10-CM

## 2022-08-26 DIAGNOSIS — N401 Enlarged prostate with lower urinary tract symptoms: Secondary | ICD-10-CM | POA: Diagnosis not present

## 2022-08-26 DIAGNOSIS — E782 Mixed hyperlipidemia: Secondary | ICD-10-CM | POA: Diagnosis not present

## 2022-08-26 LAB — LIPID PANEL
Chol/HDL Ratio: 5.7 ratio — ABNORMAL HIGH (ref 0.0–5.0)
Cholesterol, Total: 183 mg/dL (ref 100–199)
HDL: 32 mg/dL — ABNORMAL LOW (ref >39)
LDL Chol Calc (NIH): 86 mg/dL (ref 0–99)
Triglycerides: 397 mg/dL — ABNORMAL HIGH (ref 0–149)
VLDL Cholesterol Cal: 65 mg/dL — ABNORMAL HIGH (ref 5–40)

## 2022-08-26 LAB — CMP14+EGFR
ALT: 29 IU/L (ref 0–44)
AST: 25 IU/L (ref 0–40)
Albumin/Globulin Ratio: 2.5 — ABNORMAL HIGH (ref 1.2–2.2)
Albumin: 4.7 g/dL (ref 3.8–4.9)
Alkaline Phosphatase: 70 IU/L (ref 44–121)
BUN/Creatinine Ratio: 13 (ref 9–20)
BUN: 13 mg/dL (ref 6–24)
Bilirubin Total: 1.6 mg/dL — ABNORMAL HIGH (ref 0.0–1.2)
CO2: 23 mmol/L (ref 20–29)
Calcium: 9.5 mg/dL (ref 8.7–10.2)
Chloride: 99 mmol/L (ref 96–106)
Creatinine, Ser: 0.99 mg/dL (ref 0.76–1.27)
Globulin, Total: 1.9 g/dL (ref 1.5–4.5)
Glucose: 98 mg/dL (ref 70–99)
Potassium: 4.7 mmol/L (ref 3.5–5.2)
Sodium: 137 mmol/L (ref 134–144)
Total Protein: 6.6 g/dL (ref 6.0–8.5)
eGFR: 89 mL/min/{1.73_m2} (ref >59)

## 2022-08-26 LAB — CBC WITH DIFFERENTIAL/PLATELET
Basophils Absolute: 0.1 10*3/uL (ref 0.0–0.2)
Basos: 1 %
EOS (ABSOLUTE): 0.2 10*3/uL (ref 0.0–0.4)
Eos: 4 %
Hematocrit: 46.1 % (ref 37.5–51.0)
Hemoglobin: 15.9 g/dL (ref 13.0–17.7)
Immature Grans (Abs): 0 10*3/uL (ref 0.0–0.1)
Immature Granulocytes: 0 %
Lymphocytes Absolute: 1.9 10*3/uL (ref 0.7–3.1)
Lymphs: 30 %
MCH: 30.2 pg (ref 26.6–33.0)
MCHC: 34.5 g/dL (ref 31.5–35.7)
MCV: 88 fL (ref 79–97)
Monocytes Absolute: 0.8 10*3/uL (ref 0.1–0.9)
Monocytes: 13 %
Neutrophils Absolute: 3.2 10*3/uL (ref 1.4–7.0)
Neutrophils: 52 %
Platelets: 180 10*3/uL (ref 150–450)
RBC: 5.26 x10E6/uL (ref 4.14–5.80)
RDW: 12.8 % (ref 11.6–15.4)
WBC: 6.2 10*3/uL (ref 3.4–10.8)

## 2022-08-26 LAB — BAYER DCA HB A1C WAIVED: HB A1C (BAYER DCA - WAIVED): 5.1 % (ref 4.8–5.6)

## 2022-08-26 LAB — GLUCOSE HEMOCUE WAIVED: Glu Hemocue Waived: 76 mg/dL (ref 70–99)

## 2022-08-26 MED ORDER — TADALAFIL 5 MG PO TABS: 5.0000 mg | ORAL_TABLET | Freq: Every day | ORAL | 11 refills | Status: DC

## 2022-08-26 NOTE — Progress Notes (Signed)
Subjective:  Patient ID: Duane Sharp, male    DOB: Jul 22, 1965  Age: 57 y.o. MRN: [id number]  CC: No chief complaint on file.   HPI SALMAAN HONKOMP presents for "[employer]obby so hard." Was very active at [employer]. Now Has to eat every couple of days. Eats healthy, natural, whole foods.  Felt critically ill on awakening a few days ago. Felt better after eating a candy bar.   Had a weak spell a few days ago. Had 4 drinks the night before.      08/26/2022    8:21 AM 04/02/2022    8:00 AM 03/31/2021    3:52 PM  Depression screen PHQ 2/9  Decreased Interest 0 0 0  Down, Depressed, Hopeless 0 0 0  PHQ - 2 Score 0 0 0    History Ayodeji has a past medical history of Hyperlipidemia.   He has a past surgical history that includes deviated septum repair and Vasectomy.   His family history includes Cancer in his mother; Heart disease in his father and mother; Multiple myeloma in his father.He reports that he has never smoked. He uses smokeless tobacco. He reports current alcohol use. He reports that he does not currently use drugs.    ROS Review of Systems  Constitutional:  Negative for fever.  Respiratory:  Negative for shortness of breath.   Cardiovascular:  Negative for chest pain.  Musculoskeletal:  Negative for arthralgias.  Skin:  Negative for rash.    Objective:  BP 117/68   Pulse 73   Temp (!) 97.3 F (36.3 C)   Ht 6\' 2"  (1.88 m)   Wt 190 lb (86.2 kg)   SpO2 94%   BMI 24.39 kg/m   BP Readings from Last 3 Encounters:  08/26/22 117/68  04/02/22 114/71  03/31/21 110/62    Wt Readings from Last 3 Encounters:  08/26/22 190 lb (86.2 kg)  04/02/22 182 lb 12.8 oz (82.9 kg)  03/31/21 186 lb 3.2 oz (84.5 kg)     Physical Exam Vitals reviewed.  Constitutional:      Appearance: He is well-developed.  HENT:     Head: Normocephalic and atraumatic.     Right Ear: External ear normal.     Left Ear: External ear normal.     Mouth/Throat:     Pharynx: No oropharyngeal  exudate or posterior oropharyngeal erythema.  Eyes:     Pupils: Pupils are equal, round, and reactive to light.  Cardiovascular:     Rate and Rhythm: Normal rate and regular rhythm.     Heart sounds: No murmur heard. Pulmonary:     Effort: No respiratory distress.     Breath sounds: Normal breath sounds.  Musculoskeletal:     Cervical back: Normal range of motion and neck supple.  Neurological:     Mental Status: He is alert and oriented to person, place, and time.    Results for orders placed or performed in visit on 08/26/22  Bayer DCA Hb A1c Waived  Result Value Ref Range   HB A1C (BAYER DCA - WAIVED) 5.1 4.8 - 5.6 %  Glucose Hemocue Waived  Result Value Ref Range   Glu Hemocue Waived 76 70 - 99 mg/dL      Assessment & Plan:   Diagnoses and all orders for this visit:  Mixed hyperlipidemia -     Lipid panel  Screening for diabetes mellitus -     Bayer DCA Hb A1c Waived -     CMP14+EGFR -  CBC with Differential/Platelet -     Glucose Hemocue Waived  Benign prostatic hyperplasia with nocturia  Other orders -     tadalafil (CIALIS) 5 MG tablet; Take 1 tablet (5 mg total) by mouth daily.       I have discontinued Wardell Honour. Bowden's sildenafil. I am also having him start on tadalafil. Additionally, I am having him maintain his Multiple Vitamin (MULTIVITAMIN ADULT PO), B Complex Vitamins (B COMPLEX PO), aspirin EC, Vitamin D (Ergocalciferol), rosuvastatin, and calcium carbonate.  Allergies as of 08/26/2022       Reactions   Simvastatin    myalgia        Medication List        Accurate as of August 26, 2022  9:18 PM. If you have any questions, ask your nurse or doctor.          STOP taking these medications    sildenafil 20 MG tablet Commonly known as: REVATIO Stopped by: Mechele Claude, MD       TAKE these medications    aspirin EC 81 MG tablet Take 81 mg by mouth daily. Swallow whole.   B COMPLEX PO Take by mouth.   calcium carbonate 600  MG Tabs tablet Commonly known as: Calcium 600 Take 1 tablet (600 mg total) by mouth 2 (two) times daily with a meal.   MULTIVITAMIN ADULT PO Take by mouth.   rosuvastatin 10 MG tablet Commonly known as: Crestor Take 1 tablet (10 mg total) by mouth daily. For cholesterol   tadalafil 5 MG tablet Commonly known as: CIALIS Take 1 tablet (5 mg total) by mouth daily. Started by: Mechele Claude, MD   Vitamin D (Ergocalciferol) 1.25 MG (50000 UNIT) Caps capsule Commonly known as: DRISDOL Take 1 capsule (50,000 Units total) by mouth every 7 (seven) days.         Follow-up: Return in about 1 month (around 09/25/2022) for [employer] physical.  Mechele Claude, M.D.

## 2022-08-31 ENCOUNTER — Other Ambulatory Visit: Payer: Self-pay | Admitting: Family Medicine

## 2022-09-02 ENCOUNTER — Other Ambulatory Visit: Payer: Self-pay | Admitting: Family Medicine

## 2022-11-27 ENCOUNTER — Other Ambulatory Visit: Payer: Self-pay | Admitting: Family Medicine

## 2023-03-30 ENCOUNTER — Telehealth: Payer: Self-pay | Admitting: Family Medicine

## 2023-03-30 ENCOUNTER — Other Ambulatory Visit: Payer: Self-pay | Admitting: Family Medicine

## 2023-03-30 DIAGNOSIS — Z0000 Encounter for general adult medical examination without abnormal findings: Secondary | ICD-10-CM

## 2023-03-30 DIAGNOSIS — E559 Vitamin D deficiency, unspecified: Secondary | ICD-10-CM

## 2023-03-30 DIAGNOSIS — E782 Mixed hyperlipidemia: Secondary | ICD-10-CM

## 2023-03-30 DIAGNOSIS — N401 Enlarged prostate with lower urinary tract symptoms: Secondary | ICD-10-CM

## 2023-03-30 NOTE — Telephone Encounter (Signed)
 Copied from CRM [id number]. Topic: Clinical - Request for Lab/Test Order >> Mar 30, 2023 10:57 AM Delon T wrote: Reason for CRM: has a physical on Monday 1/13, wants to know if he needs to do labs before appointment, he does not want to do them after the appointment if any need to be done. Please call patient [phone]

## 2023-03-30 NOTE — Telephone Encounter (Signed)
 Labs ordered appt made patient aware

## 2023-03-31 ENCOUNTER — Other Ambulatory Visit: Payer: PRIVATE HEALTH INSURANCE

## 2023-03-31 DIAGNOSIS — N401 Enlarged prostate with lower urinary tract symptoms: Secondary | ICD-10-CM

## 2023-03-31 DIAGNOSIS — E559 Vitamin D deficiency, unspecified: Secondary | ICD-10-CM

## 2023-03-31 DIAGNOSIS — Z0000 Encounter for general adult medical examination without abnormal findings: Secondary | ICD-10-CM

## 2023-03-31 DIAGNOSIS — E782 Mixed hyperlipidemia: Secondary | ICD-10-CM

## 2023-04-01 LAB — CBC WITH DIFFERENTIAL/PLATELET
Basophils Absolute: 0.1 10*3/uL (ref 0.0–0.2)
Basos: 1 %
EOS (ABSOLUTE): 0.3 10*3/uL (ref 0.0–0.4)
Eos: 4 %
Hematocrit: 44.7 % (ref 37.5–51.0)
Hemoglobin: 15.5 g/dL (ref 13.0–17.7)
Immature Grans (Abs): 0 10*3/uL (ref 0.0–0.1)
Immature Granulocytes: 0 %
Lymphocytes Absolute: 1.9 10*3/uL (ref 0.7–3.1)
Lymphs: 33 %
MCH: 30.8 pg (ref 26.6–33.0)
MCHC: 34.7 g/dL (ref 31.5–35.7)
MCV: 89 fL (ref 79–97)
Monocytes Absolute: 0.6 10*3/uL (ref 0.1–0.9)
Monocytes: 11 %
Neutrophils Absolute: 3 10*3/uL (ref 1.4–7.0)
Neutrophils: 51 %
Platelets: 162 10*3/uL (ref 150–450)
RBC: 5.03 x10E6/uL (ref 4.14–5.80)
RDW: 12.2 % (ref 11.6–15.4)
WBC: 5.9 10*3/uL (ref 3.4–10.8)

## 2023-04-01 LAB — CMP14+EGFR
ALT: 33 [IU]/L (ref 0–44)
AST: 24 [IU]/L (ref 0–40)
Albumin: 4.5 g/dL (ref 3.8–4.9)
Alkaline Phosphatase: 67 [IU]/L (ref 44–121)
BUN/Creatinine Ratio: 11 (ref 9–20)
BUN: 12 mg/dL (ref 6–24)
Bilirubin Total: 1.4 mg/dL — ABNORMAL HIGH (ref 0.0–1.2)
CO2: 22 mmol/L (ref 20–29)
Calcium: 9.4 mg/dL (ref 8.7–10.2)
Chloride: 102 mmol/L (ref 96–106)
Creatinine, Ser: 1.07 mg/dL (ref 0.76–1.27)
Globulin, Total: 1.9 g/dL (ref 1.5–4.5)
Glucose: 99 mg/dL (ref 70–99)
Potassium: 4.6 mmol/L (ref 3.5–5.2)
Sodium: 137 mmol/L (ref 134–144)
Total Protein: 6.4 g/dL (ref 6.0–8.5)
eGFR: 80 mL/min/{1.73_m2} (ref >59)

## 2023-04-01 LAB — LIPID PANEL
Chol/HDL Ratio: 3.9 {ratio} (ref 0.0–5.0)
Cholesterol, Total: 149 mg/dL (ref 100–199)
HDL: 38 mg/dL — ABNORMAL LOW (ref >39)
LDL Chol Calc (NIH): 82 mg/dL (ref 0–99)
Triglycerides: 170 mg/dL — ABNORMAL HIGH (ref 0–149)
VLDL Cholesterol Cal: 29 mg/dL (ref 5–40)

## 2023-04-01 LAB — PSA, TOTAL AND FREE
PSA, Free Pct: 50 %
PSA, Free: 0.2 ng/mL
Prostate Specific Ag, Serum: 0.4 ng/mL (ref 0.0–4.0)

## 2023-04-01 LAB — VITAMIN D 25 HYDROXY (VIT D DEFICIENCY, FRACTURES): Vit D, 25-Hydroxy: 44.4 ng/mL (ref 30.0–100.0)

## 2023-04-01 LAB — TESTOSTERONE,FREE AND TOTAL
Testosterone, Free: 8.3 pg/mL (ref 7.2–24.0)
Testosterone: 134 ng/dL — ABNORMAL LOW (ref 264–916)

## 2023-04-05 ENCOUNTER — Encounter: Payer: Self-pay | Admitting: Family Medicine

## 2023-04-05 ENCOUNTER — Ambulatory Visit: Payer: PRIVATE HEALTH INSURANCE | Admitting: Family Medicine

## 2023-04-05 VITALS — BP 122/74 | HR 74 | Temp 97.7°F | Ht 74.0 in | Wt 196.4 lb

## 2023-04-05 DIAGNOSIS — E291 Testicular hypofunction: Secondary | ICD-10-CM | POA: Insufficient documentation

## 2023-04-05 DIAGNOSIS — E559 Vitamin D deficiency, unspecified: Secondary | ICD-10-CM | POA: Diagnosis not present

## 2023-04-05 DIAGNOSIS — Z0000 Encounter for general adult medical examination without abnormal findings: Secondary | ICD-10-CM

## 2023-04-05 DIAGNOSIS — E782 Mixed hyperlipidemia: Secondary | ICD-10-CM | POA: Diagnosis not present

## 2023-04-05 DIAGNOSIS — Z0001 Encounter for general adult medical examination with abnormal findings: Secondary | ICD-10-CM | POA: Diagnosis not present

## 2023-04-05 DIAGNOSIS — Z23 Encounter for immunization: Secondary | ICD-10-CM | POA: Diagnosis not present

## 2023-04-05 MED ORDER — ANDROGEL 20.25 MG/1.25GM (1.62%) TD GEL: TRANSDERMAL | 5 refills | Status: DC

## 2023-04-05 NOTE — Progress Notes (Signed)
 Subjective:  Patient ID: Duane Sharp, male    DOB: Apr 10, 1965  Age: 58 y.o. MRN: [id number]  CC: Annual Exam (Prostate swelling// ongoing/Medication not helping.) and Labs Only (Would like to discuss elevated lab levels. )   HPICPE Duane Sharp presents for CPE. Testosterone  low (2nd time) requesting tx. Libido falling off.      04/05/2023    9:31 AM 08/26/2022    8:21 AM 04/02/2022    8:00 AM  Depression screen PHQ 2/9  Decreased Interest 0 0 0  Down, Depressed, Hopeless 0 0 0  PHQ - 2 Score 0 0 0    History Duane Sharp has a past medical history of Hyperlipidemia.   Duane Sharp has a past surgical history that includes deviated septum repair and Vasectomy.   His family history includes Cancer in his mother; Heart disease in his father and mother; Multiple myeloma in his father.Duane Sharp reports that Duane Sharp has never smoked. Duane Sharp uses smokeless tobacco. Duane Sharp reports current alcohol use. Duane Sharp reports that Duane Sharp does not currently use drugs.    ROS Review of Systems  Constitutional:  Negative for activity change, fatigue and unexpected weight change.  HENT:  Negative for congestion, ear pain, hearing loss, postnasal drip and trouble swallowing.   Eyes:  Negative for pain and visual disturbance.  Respiratory:  Negative for cough, chest tightness and shortness of breath.   Cardiovascular:  Negative for chest pain, palpitations and leg swelling.  Gastrointestinal:  Negative for abdominal distention, abdominal pain, blood in stool, constipation, diarrhea, nausea and vomiting.  Endocrine: Negative for cold intolerance, heat intolerance and polydipsia.  Genitourinary:  Negative for difficulty urinating, dysuria, flank pain, frequency and urgency.  Musculoskeletal:  Negative for arthralgias and joint swelling.  Skin:  Negative for color change, rash and wound.  Neurological:  Negative for dizziness, syncope, speech difficulty, weakness, light-headedness, numbness and headaches.  Hematological:  Does not  bruise/bleed easily.  Psychiatric/Behavioral:  Negative for confusion, decreased concentration, dysphoric mood and sleep disturbance. The patient is not nervous/anxious.     Objective:  BP 122/74   Pulse 74   Temp 97.7 F (36.5 C) (Temporal)   Ht 6' 2 (1.88 m)   Wt 196 lb 6.4 oz (89.1 kg)   SpO2 94%   BMI 25.22 kg/m   BP Readings from Last 3 Encounters:  04/05/23 122/74  08/26/22 117/68  04/02/22 114/71    Wt Readings from Last 3 Encounters:  04/05/23 196 lb 6.4 oz (89.1 kg)  08/26/22 190 lb (86.2 kg)  04/02/22 182 lb 12.8 oz (82.9 kg)     Physical Exam Constitutional:      Appearance: Duane Sharp is well-developed.  HENT:     Head: Normocephalic and atraumatic.  Eyes:     Pupils: Pupils are equal, round, and reactive to light.  Neck:     Thyroid : No thyromegaly.     Trachea: No tracheal deviation.  Cardiovascular:     Rate and Rhythm: Normal rate and regular rhythm.     Heart sounds: Normal heart sounds. No murmur heard.    No friction rub. No gallop.  Pulmonary:     Breath sounds: Normal breath sounds. No wheezing or rales.  Abdominal:     General: Bowel sounds are normal. There is no distension.     Palpations: Abdomen is soft. There is no mass.     Tenderness: There is no abdominal tenderness.     Hernia: There is no hernia in the left inguinal area.  Genitourinary:  Penis: Normal.      Testes: Normal.  Musculoskeletal:        General: Normal range of motion.     Cervical back: Normal range of motion.  Lymphadenopathy:     Cervical: No cervical adenopathy.  Skin:    General: Skin is warm and dry.  Neurological:     Mental Status: Duane Sharp is alert and oriented to person, place, and time.       Assessment & Plan:   Duane Sharp was seen today for annual exam and labs only.  Diagnoses and all orders for this visit:  Immunization due -     Tdap vaccine greater than or equal to 7yo IM  Well adult exam  Mixed hyperlipidemia  Vitamin D   deficiency  Hypogonadism in male  Other orders -     ANDROGEL  20.25 MG/1.25GM (1.62%) GEL; Apply four pumps to the upper chest daily       I am having Duane Sharp start on AndroGel . I am also having him maintain his Multiple Vitamin (MULTIVITAMIN ADULT PO), B Complex Vitamins (B COMPLEX PO), aspirin EC, Vitamin D  (Ergocalciferol ), calcium  carbonate, tadalafil , rosuvastatin , and MAGNESIUM COMPLEX HIGH POTENCY PO.  Allergies as of 04/05/2023       Reactions   Simvastatin     myalgia        Medication List        Accurate as of April 05, 2023 10:16 AM. If you have any questions, ask your nurse or doctor.          AndroGel  20.25 MG/1.25GM (1.62%) Gel Generic drug: Testosterone  Apply four pumps to the upper chest daily Started by: Butler Avis Mcmahill   aspirin EC 81 MG tablet Take 81 mg by mouth daily. Swallow whole.   B COMPLEX PO Take by mouth.   calcium  carbonate 600 MG Tabs tablet Commonly known as: Calcium  600 Take 1 tablet (600 mg total) by mouth 2 (two) times daily with a meal.   MAGNESIUM COMPLEX HIGH POTENCY PO Take 325 mg by mouth once.   MULTIVITAMIN ADULT PO Take by mouth.   rosuvastatin  10 MG tablet Commonly known as: CRESTOR  TAKE 1 TABLET BY MOUTH DAILY. FOR CHOLESTEROL   tadalafil  5 MG tablet Commonly known as: CIALIS  Take 1 tablet (5 mg total) by mouth daily.   Vitamin D  (Ergocalciferol ) 1.25 MG (50000 UNIT) Caps capsule Commonly known as: DRISDOL  Take 1 capsule (50,000 Units total) by mouth every 7 (seven) days.       Pt. Educated regarding pros & cons of testosterone  tx. Expresses satisfaction with the cialis  for BPH.   Follow-up: Return in about 3 months (around 07/04/2023) for testosterone .  Butler Der, M.D.

## 2023-05-07 ENCOUNTER — Other Ambulatory Visit (HOSPITAL_COMMUNITY): Payer: Self-pay

## 2023-05-07 ENCOUNTER — Telehealth: Payer: Self-pay

## 2023-05-07 NOTE — Telephone Encounter (Addendum)
 Pharmacy Patient Advocate Encounter   Received notification from Fax that prior authorization for AndroGel Pump 20.25 MG/ACT(1.62%) gel  is required/requested.   Insurance verification completed.   The patient is insured through [employer] .   Per test claim: PA required; PA submitted to above mentioned insurance via CoverMyMeds Key/confirmation #/EOC [id number] Status is pending

## 2023-05-10 ENCOUNTER — Other Ambulatory Visit (HOSPITAL_COMMUNITY): Payer: Self-pay

## 2023-05-11 ENCOUNTER — Other Ambulatory Visit (HOSPITAL_COMMUNITY): Payer: Self-pay

## 2023-05-17 ENCOUNTER — Other Ambulatory Visit (HOSPITAL_COMMUNITY): Payer: Self-pay

## 2023-05-17 NOTE — Telephone Encounter (Signed)
 Pharmacy Patient Advocate Encounter  Received notification from [facility] that Prior Authorization for AndroGel Pump 20.25 MG/ACT(1.62%) gel has been DENIED.  See denial reason below. No denial letter attached in CMM. Will attach denial letter to Media tab once received.   PA #/Case ID/Reference #: [id number]   DENIAL REASON:  Inconsistent Dose Requested. Insurance will cover 1.250 daily.  Ran test claim for 37.5g for 30 day supply, copay is $100

## 2023-05-21 ENCOUNTER — Other Ambulatory Visit: Payer: Self-pay | Admitting: Family Medicine

## 2023-05-21 MED ORDER — ANDROGEL 20.25 MG/1.25GM (1.62%) TD GEL: TRANSDERMAL | 5 refills | Status: DC

## 2023-05-21 NOTE — Telephone Encounter (Signed)
 I reviewed the denial letter. They will only allow a 150 gm supply per month. I sent a prescription [facility] [place]. Let me know how that works out.

## 2023-05-27 ENCOUNTER — Other Ambulatory Visit: Payer: Self-pay | Admitting: Family Medicine

## 2023-06-17 ENCOUNTER — Other Ambulatory Visit: Payer: Self-pay | Admitting: Family Medicine

## 2023-06-17 MED ORDER — TADALAFIL 5 MG PO TABS: 5.0000 mg | ORAL_TABLET | Freq: Every day | ORAL | 5 refills | Status: DC

## 2023-06-17 NOTE — Telephone Encounter (Signed)
 Copied from CRM [id number]. Topic: Clinical - Medication Refill >> Jun 17, 2023 11:33 AM Ivette P wrote: Most Recent Primary Care Visit:   Medication: tadalafil (CIALIS) 5 MG tablet  Has the patient contacted their pharmacy? Yes (Agent: If no, request that the patient contact the pharmacy for the refill. If patient does not wish to contact the pharmacy document the reason why and proceed with request.) (Agent: If yes, when and what did the pharmacy advise?)  Is this the correct pharmacy for this prescription? Yes If no, delete pharmacy and type the correct one.  This is the patient's preferred pharmacy:  [id number] [place] [place] - [address] [place] [address] Phone: [phone] Fax: [phone]   Has the prescription been filled recently? No, last fill 08/26/2022  Is the patient out of the medication? Yes  Has the patient been seen for an appointment in the last year OR does the patient have an upcoming appointment? Yes  Can we respond through MyChart? Yes  Agent: Please be advised that Rx refills may take up to 3 business days. We ask that you follow-up with your pharmacy.

## 2023-06-18 NOTE — Telephone Encounter (Signed)
 Pt aware refill sent to pharmacy

## 2023-06-25 ENCOUNTER — Other Ambulatory Visit: Payer: Self-pay | Admitting: Family Medicine

## 2023-07-05 ENCOUNTER — Ambulatory Visit: Payer: PRIVATE HEALTH INSURANCE | Admitting: Family Medicine

## 2023-08-10 ENCOUNTER — Encounter: Payer: Self-pay | Admitting: Family Medicine

## 2023-08-10 ENCOUNTER — Ambulatory Visit (INDEPENDENT_AMBULATORY_CARE_PROVIDER_SITE_OTHER): Payer: PRIVATE HEALTH INSURANCE | Admitting: Family Medicine

## 2023-08-10 VITALS — BP 111/60 | HR 70 | Temp 97.9°F | Ht 74.0 in | Wt 198.0 lb

## 2023-08-10 DIAGNOSIS — H5789 Other specified disorders of eye and adnexa: Secondary | ICD-10-CM

## 2023-08-10 DIAGNOSIS — E782 Mixed hyperlipidemia: Secondary | ICD-10-CM

## 2023-08-10 DIAGNOSIS — E291 Testicular hypofunction: Secondary | ICD-10-CM | POA: Diagnosis not present

## 2023-08-10 MED ORDER — ANDROGEL 20.25 MG/1.25GM (1.62%) TD GEL: TRANSDERMAL | 5 refills | Status: DC

## 2023-08-10 MED ORDER — ROSUVASTATIN CALCIUM 10 MG PO TABS: 10.0000 mg | ORAL_TABLET | Freq: Every day | ORAL | 0 refills | Status: DC

## 2023-08-10 NOTE — Progress Notes (Signed)
 Subjective:  Patient ID: Duane Sharp, male    DOB: 1966-01-27  Age: 58 y.o. MRN: [id number]  CC: Medical Management of Chronic Issues and Conjunctivitis (Right eye. Started a month ago after visit to eye doctor. Drainage, itching an dbump on eye. )   HPI Duane Sharp presents for concern about chronic conjunctivitis with mucoid drainage from the left eye.  Duane Sharp has not been able to find any satisfactory treatment.  It does not compromise his vision.  Patient in for follow-up of elevated cholesterol. Doing well without complaints on current medication. Denies side effects of statin including myalgia and arthralgia and nausea. Also in today for liver function testing. Currently no chest pain, shortness of breath or other cardiovascular related symptoms noted.   Follow up for testosterone  deficiency: Pt. Using medication as directed. Denies any sx referrable to DVT such as edema or erythema of legs. No dyspnea or chest pain. Energy level reported as being good. Libido is normal and denies E.D. Feels strength is adequate and improved from baseline.      04/05/2023    9:31 AM 08/26/2022    8:21 AM 04/02/2022    8:00 AM  Depression screen PHQ 2/9  Decreased Interest 0 0 0  Down, Depressed, Hopeless 0 0 0  PHQ - 2 Score 0 0 0    History Duane Sharp has a past medical history of Hyperlipidemia.   Duane Sharp has a past surgical history that includes deviated septum repair and Vasectomy.   His family history includes Cancer in his mother; Heart disease in his father and mother; Multiple myeloma in his father.Duane Sharp reports that Duane Sharp has never smoked. Duane Sharp uses smokeless tobacco. Duane Sharp reports current alcohol use. Duane Sharp reports that Duane Sharp does not currently use drugs.    ROS Review of Systems  Constitutional: Negative.   HENT: Negative.    Eyes:  Negative for visual disturbance.  Respiratory:  Negative for cough and shortness of breath.   Cardiovascular:  Negative for chest pain and leg swelling.   Gastrointestinal:  Negative for abdominal pain, diarrhea, nausea and vomiting.  Genitourinary:  Negative for difficulty urinating.  Musculoskeletal:  Negative for arthralgias and myalgias.  Skin:  Negative for rash.  Neurological:  Negative for headaches.  Psychiatric/Behavioral:  Negative for sleep disturbance.     Objective:  BP 111/60   Pulse 70   Temp 97.9 F (36.6 C)   Ht 6\' 2"  (1.88 m)   Wt 198 lb (89.8 kg)   SpO2 97%   BMI 25.42 kg/m   BP Readings from Last 3 Encounters:  08/10/23 111/60  04/05/23 122/74  08/26/22 117/68    Wt Readings from Last 3 Encounters:  08/10/23 198 lb (89.8 kg)  04/05/23 196 lb 6.4 oz (89.1 kg)  08/26/22 190 lb (86.2 kg)     Physical Exam Vitals reviewed.  Constitutional:      Appearance: Duane Sharp is well-developed.  HENT:     Head: Normocephalic and atraumatic.     Right Ear: External ear normal.     Left Ear: External ear normal.     Mouth/Throat:     Pharynx: No oropharyngeal exudate or posterior oropharyngeal erythema.  Eyes:     Pupils: Pupils are equal, round, and reactive to light.  Cardiovascular:     Rate and Rhythm: Normal rate and regular rhythm.     Heart sounds: No murmur heard. Pulmonary:     Effort: No respiratory distress.     Breath sounds: Normal breath sounds.  Musculoskeletal:     Cervical back: Normal range of motion and neck supple.  Neurological:     Mental Status: Duane Sharp is alert and oriented to person, place, and time.      Assessment & Plan:  Mixed hyperlipidemia -     Lipid panel -     CMP14+EGFR  Hypogonadism in male -     Testosterone ,Free and Total  Discharge of eye, right -     Ambulatory referral to Ophthalmology  Other orders -     AndroGel ; Apply four pumps to the upper chest daily  Dispense: 150 g; Refill: 5 -     Rosuvastatin  Calcium ; Take 1 tablet (10 mg total) by mouth daily.  Dispense: 90 tablet; Refill: 0     Follow-up: Return in about 6 months (around 02/10/2024) for  cholesterol, testosterone .  Roise Cleaver, M.D.

## 2023-08-11 LAB — TESTOSTERONE,FREE AND TOTAL
Testosterone, Free: 13.1 pg/mL (ref 7.2–24.0)
Testosterone: 194 ng/dL — ABNORMAL LOW (ref 264–916)

## 2023-08-11 LAB — CMP14+EGFR
ALT: 33 IU/L (ref 0–44)
AST: 27 IU/L (ref 0–40)
Albumin: 4.9 g/dL (ref 3.8–4.9)
Alkaline Phosphatase: 77 IU/L (ref 44–121)
BUN/Creatinine Ratio: 13 (ref 9–20)
BUN: 12 mg/dL (ref 6–24)
Bilirubin Total: 1.3 mg/dL — ABNORMAL HIGH (ref 0.0–1.2)
CO2: 21 mmol/L (ref 20–29)
Calcium: 9.5 mg/dL (ref 8.7–10.2)
Chloride: 101 mmol/L (ref 96–106)
Creatinine, Ser: 0.92 mg/dL (ref 0.76–1.27)
Globulin, Total: 1.9 g/dL (ref 1.5–4.5)
Glucose: 97 mg/dL (ref 70–99)
Potassium: 4.5 mmol/L (ref 3.5–5.2)
Sodium: 138 mmol/L (ref 134–144)
Total Protein: 6.8 g/dL (ref 6.0–8.5)
eGFR: 96 mL/min/{1.73_m2} (ref >59)

## 2023-08-11 LAB — LIPID PANEL
Chol/HDL Ratio: 4.4 ratio (ref 0.0–5.0)
Cholesterol, Total: 172 mg/dL (ref 100–199)
HDL: 39 mg/dL — ABNORMAL LOW (ref >39)
LDL Chol Calc (NIH): 96 mg/dL (ref 0–99)
Triglycerides: 219 mg/dL — ABNORMAL HIGH (ref 0–149)
VLDL Cholesterol Cal: 37 mg/dL (ref 5–40)

## 2023-08-12 ENCOUNTER — Ambulatory Visit: Payer: Self-pay | Admitting: Family Medicine

## 2023-09-06 ENCOUNTER — Telehealth: Payer: Self-pay | Admitting: Family Medicine

## 2023-09-06 NOTE — Telephone Encounter (Signed)
 Patient aware and verbalized understanding.

## 2023-09-06 NOTE — Telephone Encounter (Signed)
 Referral has been re-directed to [facility] in [place] at this time.

## 2023-09-06 NOTE — Telephone Encounter (Signed)
 Copied from CRM [id number]. Topic: Referral - Status >> Sep 06, 2023  2:01 PM Donald Frost wrote: Reason for CRM: Marily Shows with [facility] called stating there are just surgeons at the office and they do not do anything with conjunctivitis. She states he would need an optometrist for this if he doesn't already have one and that as far as the bump on the eyelid goes if the Optometrist feels after treating the conjunctivitis he needs a referral for surgery consult concerning the bump they can send it then. Please assist patient further.

## 2023-10-29 ENCOUNTER — Other Ambulatory Visit: Payer: Self-pay | Admitting: Medical Genetics

## 2023-11-18 ENCOUNTER — Ambulatory Visit: Payer: PRIVATE HEALTH INSURANCE | Admitting: Family Medicine

## 2023-11-24 ENCOUNTER — Other Ambulatory Visit: Payer: Self-pay | Admitting: Family Medicine

## 2023-11-26 ENCOUNTER — Other Ambulatory Visit: Payer: Self-pay | Admitting: Family Medicine

## 2024-01-18 ENCOUNTER — Other Ambulatory Visit: Payer: Self-pay | Admitting: Medical Genetics

## 2024-01-18 DIAGNOSIS — Z006 Encounter for examination for normal comparison and control in clinical research program: Secondary | ICD-10-CM

## 2024-01-23 ENCOUNTER — Other Ambulatory Visit: Payer: Self-pay | Admitting: *Deleted

## 2024-02-10 ENCOUNTER — Ambulatory Visit: Payer: PRIVATE HEALTH INSURANCE | Admitting: Family Medicine

## 2024-02-10 ENCOUNTER — Encounter: Payer: Self-pay | Admitting: Family Medicine

## 2024-02-10 VITALS — BP 113/68 | HR 48 | Temp 97.7°F | Ht 74.0 in | Wt 192.0 lb

## 2024-02-10 DIAGNOSIS — N401 Enlarged prostate with lower urinary tract symptoms: Secondary | ICD-10-CM | POA: Diagnosis not present

## 2024-02-10 DIAGNOSIS — E782 Mixed hyperlipidemia: Secondary | ICD-10-CM

## 2024-02-10 DIAGNOSIS — E291 Testicular hypofunction: Secondary | ICD-10-CM

## 2024-02-10 DIAGNOSIS — R351 Nocturia: Secondary | ICD-10-CM | POA: Diagnosis not present

## 2024-02-10 LAB — LIPID PANEL

## 2024-02-10 MED ORDER — ROSUVASTATIN CALCIUM 10 MG PO TABS: 10.0000 mg | ORAL_TABLET | Freq: Every day | ORAL | 0 refills | Status: DC

## 2024-02-10 NOTE — Progress Notes (Signed)
 Subjective:  Patient ID: Duane Sharp, male    DOB: March 10, 1966  Age: 58 y.o. MRN: [id number]  CC: Medical Management of Chronic Issues   HPI  Discussed the use of AI scribe software for clinical note transcription with the patient, who gave verbal consent to proceed.  History of Present Illness Duane Sharp is a 58 year old male who presents for follow up of testosterone  supplementation and E.D. as well as elevated cholesterol tx.  He is on testosterone  replacement therapy using four pumps of gel daily. He has noticed some increase in hair on his arms and legs and possibly more energy, but no significant changes in libido or muscle mass. He is curious about more concentrated doses of the gel.  He takes daily tadalafil , which he appreciates for its effects on morning erections and spontaneous sexual activity. He previously used sildenafil  but prefers tadalafil  due to fewer side effects and more convenience.  He is on rosuvastatin  for cholesterol management and continues to take it regularly. He is also taking magnesium, zinc, vitamin C, a B complex, and fish oil supplements. He has been experiencing dry eyes and uses Refresh eye drops and fish oil to manage the symptoms.  He has been focusing on weight management by watching his carbohydrate intake and increasing his physical activity, specifically walking more.  He mentions a history of wearing boxers for the last forty years and discusses the potential impact of lack of support that could have led to testosterone  deficiency.  He is considering switching to boxer briefs for better support during physical activities.          02/10/2024    8:15 AM 04/05/2023    9:31 AM 08/26/2022    8:21 AM  Depression screen PHQ 2/9  Decreased Interest 0 0 0  Down, Depressed, Hopeless 0 0 0  PHQ - 2 Score 0 0 0  Altered sleeping 0    Tired, decreased energy 0    Change in appetite 0    Feeling bad or failure about yourself  0    Trouble  concentrating 0    Moving slowly or fidgety/restless 0    Suicidal thoughts 0    PHQ-9 Score 0    Difficult doing work/chores Not difficult at all      History [name] has a past medical history of Hyperlipidemia.   He has a past surgical history that includes deviated septum repair and Vasectomy.   His family history includes Cancer in his mother; Heart disease in his father and mother; Multiple myeloma in his father.He reports that he has never smoked. He uses smokeless tobacco. He reports current alcohol use. He reports that he does not currently use drugs.    ROS Review of Systems  Constitutional: Negative.   HENT: Negative.    Eyes:  Negative for visual disturbance.  Respiratory:  Negative for cough and shortness of breath.   Cardiovascular:  Negative for chest pain and leg swelling.  Gastrointestinal:  Negative for abdominal pain, diarrhea, nausea and vomiting.  Genitourinary:  Negative for difficulty urinating.  Musculoskeletal:  Negative for arthralgias and myalgias.  Skin:  Negative for rash.  Neurological:  Negative for headaches.  Psychiatric/Behavioral:  Negative for sleep disturbance.     Objective:  BP 113/68   Pulse (!) 48   Temp 97.7 F (36.5 C)   Ht 6' 2 (1.88 m)   Wt 192 lb (87.1 kg)   SpO2 96%   BMI 24.65 kg/m  BP Readings from Last 3 Encounters:  02/10/24 113/68  08/10/23 111/60  04/05/23 122/74    Wt Readings from Last 3 Encounters:  02/10/24 192 lb (87.1 kg)  08/10/23 198 lb (89.8 kg)  04/05/23 196 lb 6.4 oz (89.1 kg)     Physical Exam Physical Exam VITALS: BP- 113/68 GENERAL: Alert, cooperative, well developed, no acute distress. HEENT: Normocephalic, normal oropharynx, moist mucous membranes. CHEST: Clear to auscultation bilaterally, no wheezes, rhonchi, or crackles. CARDIOVASCULAR: Normal heart rate and rhythm, S1 and S2 normal without murmurs. ABDOMEN: Soft, non-tender, non-distended, without organomegaly, normal bowel sounds,  no costovertebral angle tenderness. EXTREMITIES: No cyanosis or edema. NEUROLOGICAL: Cranial nerves grossly intact, moves all extremities without gross motor or sensory deficit.   Assessment & Plan:  Hypogonadism in male -     Testosterone ,Free and Total -     CBC with Differential/Platelet  Mixed hyperlipidemia -     Lipid panel -     Comprehensive metabolic panel with GFR -     Rosuvastatin  Calcium ; Take 1 tablet (10 mg total) by mouth daily.  Dispense: 90 tablet; Refill: 0 -     CBC with Differential/Platelet  Benign prostatic hyperplasia with nocturia  Using tadalafil  for dual purpose of E.D. 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 Testosterone 's role in well-being and muscle maintenance was discussed, emphasizing consistent use and potential benefits of therapy. Potential causes of hypogonadism, such as viral infections and lack of testicular support during physical activities, were reviewed. Refilled Androgel  prescription and ordered a testosterone  level test to be done before 10 AM. Advised wearing supportive underwear during physical activities.  Mixed hyperlipidemia   He continues on rosuvastatin  with no new concerns. Continue rosuvastatin  10 mg orally daily. Ordered a cholesterol level test.  Chronic conjunctivitis / dry eye syndrome   He reports difficulty wearing contact lenses due to dry eyes and is using eye drops and fish oil supplements for management. Continue using eye drops as needed and fish oil supplements.  General Health Maintenance   The importance of regular exercise and dietary modifications for weight management was discussed. He was encouraged to use a fitness tracker for step counting and the benefits of supportive underwear for overall health were highlighted. Encouraged  regular exercise and dietary modifications, recommended purchasing a fitness tracker, and advised wearing supportive underwear during physical activities.       Follow-up: No follow-ups on file.  Butler Der, M.D.

## 2024-02-12 LAB — LIPID PANEL
Chol/HDL Ratio: 4.4 ratio (ref 0.0–5.0)
Cholesterol, Total: 161 mg/dL (ref 100–199)
HDL: 37 mg/dL — ABNORMAL LOW (ref >39)
LDL Chol Calc (NIH): 87 mg/dL (ref 0–99)
Triglycerides: 222 mg/dL — ABNORMAL HIGH (ref 0–149)
VLDL Cholesterol Cal: 37 mg/dL (ref 5–40)

## 2024-02-12 LAB — COMPREHENSIVE METABOLIC PANEL WITH GFR
ALT: 32 IU/L (ref 0–44)
AST: 25 IU/L (ref 0–40)
Albumin: 4.6 g/dL (ref 3.8–4.9)
Alkaline Phosphatase: 73 IU/L (ref 47–123)
BUN/Creatinine Ratio: 10 (ref 9–20)
BUN: 11 mg/dL (ref 6–24)
Bilirubin Total: 1.5 mg/dL — ABNORMAL HIGH (ref 0.0–1.2)
CO2: 24 mmol/L (ref 20–29)
Calcium: 9.4 mg/dL (ref 8.7–10.2)
Chloride: 98 mmol/L (ref 96–106)
Creatinine, Ser: 1.11 mg/dL (ref 0.76–1.27)
Globulin, Total: 2 g/dL (ref 1.5–4.5)
Glucose: 92 mg/dL (ref 70–99)
Potassium: 4.5 mmol/L (ref 3.5–5.2)
Sodium: 135 mmol/L (ref 134–144)
Total Protein: 6.6 g/dL (ref 6.0–8.5)
eGFR: 76 mL/min/1.73 (ref >59)

## 2024-02-12 LAB — CBC WITH DIFFERENTIAL/PLATELET
Basophils Absolute: 0.1 x10E3/uL (ref 0.0–0.2)
Basos: 1 %
EOS (ABSOLUTE): 0.2 x10E3/uL (ref 0.0–0.4)
Eos: 4 %
Hematocrit: 48.8 % (ref 37.5–51.0)
Hemoglobin: 16.2 g/dL (ref 13.0–17.7)
Immature Grans (Abs): 0 x10E3/uL (ref 0.0–0.1)
Immature Granulocytes: 0 %
Lymphocytes Absolute: 1.7 x10E3/uL (ref 0.7–3.1)
Lymphs: 33 %
MCH: 30.5 pg (ref 26.6–33.0)
MCHC: 33.2 g/dL (ref 31.5–35.7)
MCV: 92 fL (ref 79–97)
Monocytes Absolute: 0.5 x10E3/uL (ref 0.1–0.9)
Monocytes: 10 %
Neutrophils Absolute: 2.6 x10E3/uL (ref 1.4–7.0)
Neutrophils: 51 %
Platelets: 183 x10E3/uL (ref 150–450)
RBC: 5.31 x10E6/uL (ref 4.14–5.80)
RDW: 12.4 % (ref 11.6–15.4)
WBC: 5.1 x10E3/uL (ref 3.4–10.8)

## 2024-02-12 LAB — TESTOSTERONE,FREE AND TOTAL
Testosterone, Free: 18.3 pg/mL (ref 7.2–24.0)
Testosterone: 408 ng/dL (ref 264–916)

## 2024-02-13 ENCOUNTER — Ambulatory Visit: Payer: Self-pay | Admitting: Family Medicine

## 2024-02-13 NOTE — Progress Notes (Signed)
 Hello [place],  Your lab result is normal and/or stable.Some minor variations that are not significant are commonly marked abnormal, but do not represent any medical problem for you.  Best regards, Mechele Claude, M.D.

## 2024-03-01 ENCOUNTER — Other Ambulatory Visit: Payer: Self-pay | Admitting: Family Medicine

## 2024-03-01 ENCOUNTER — Telehealth: Payer: Self-pay | Admitting: Family Medicine

## 2024-03-01 DIAGNOSIS — E782 Mixed hyperlipidemia: Secondary | ICD-10-CM

## 2024-03-01 NOTE — Telephone Encounter (Signed)
 Copied from CRM #[id number]. Topic: Clinical - Medication Refill >> Mar 01, 2024  8:03 AM Cherylann RAMAN wrote: Medication: rosuvastatin  (CRESTOR ) 10 MG tablet  tadalafil  (CIALIS ) 5 MG tablet ANDROGEL  20.25 MG/1.25GM (1.62%) GEL   Has the patient contacted their pharmacy? Yes (Agent: If no, request that the patient contact the pharmacy for the refill. If patient does not wish to contact the pharmacy document the reason why and proceed with request.) (Agent: If yes, when and what did the pharmacy advise?) States the prescriber denied the medication  This is the patient's preferred pharmacy:  [id number] [place] [place] - [address] [place] [address] Phone: [phone] Fax: [phone]  Is this the correct pharmacy for this prescription? Yes If no, delete pharmacy and type the correct one.   Has the prescription been filled recently? No  Is the patient out of the medication? Yes  Has the patient been seen for an appointment in the last year OR does the patient have an upcoming appointment? Yes  Can we respond through MyChart? Yes  Agent: Please be advised that Rx refills may take up to 3 business days. We ask that you follow-up with your pharmacy.

## 2024-03-07 MED ORDER — ANDROGEL 20.25 MG/1.25GM (1.62%) TD GEL: TRANSDERMAL | 2 refills | Status: AC

## 2024-03-07 MED ORDER — ROSUVASTATIN CALCIUM 10 MG PO TABS: 10.0000 mg | ORAL_TABLET | Freq: Every day | ORAL | 0 refills | Status: AC

## 2024-03-07 NOTE — Telephone Encounter (Signed)
 Requested med Tadalafil  was sent in on 01/24/24 for 90-d supply w/ a RF, Rosuvastatin  was sent in on 02/10/24 for a 90-d supply, sending in another 90-d supply which will give him a 6 mos supply. Sending request for Androgel  to PCP Last OV 02/10/24. Last RF 08/10/23. Next OV NOT sched

## 2024-03-09 ENCOUNTER — Other Ambulatory Visit (HOSPITAL_COMMUNITY): Payer: Self-pay

## 2024-03-09 ENCOUNTER — Telehealth: Payer: Self-pay | Admitting: Pharmacy Technician

## 2024-03-09 NOTE — Telephone Encounter (Signed)
 Pharmacy Patient Advocate Encounter   Received notification from Onbase that prior authorization for Testosterone  1.62% gel is required/requested.   Insurance verification completed.   The patient is insured through [employer].   Per test claim: The current 30 day co-pay is, $10.00.  No PA needed at this time. This test claim was processed through [facility]- copay amounts may vary at other pharmacies due to pharmacy/plan contracts, or as the patient moves through the different stages of their insurance plan.
